# Patient Record
Sex: Female | Born: 1965 | Race: Black or African American | Hispanic: No | Marital: Married | Smoking: Never smoker
Health system: Southern US, Community
[De-identification: ages and names within clinical notes are randomized; demographics above are authoritative.]

## PROBLEM LIST (undated history)

## (undated) DIAGNOSIS — N3090 Cystitis, unspecified without hematuria: Secondary | ICD-10-CM

## (undated) DIAGNOSIS — D219 Benign neoplasm of connective and other soft tissue, unspecified: Secondary | ICD-10-CM

## (undated) DIAGNOSIS — D500 Iron deficiency anemia secondary to blood loss (chronic): Secondary | ICD-10-CM

## (undated) DIAGNOSIS — F419 Anxiety disorder, unspecified: Secondary | ICD-10-CM

## (undated) DIAGNOSIS — N921 Excessive and frequent menstruation with irregular cycle: Secondary | ICD-10-CM

## (undated) DIAGNOSIS — I10 Essential (primary) hypertension: Secondary | ICD-10-CM

## (undated) DIAGNOSIS — G43909 Migraine, unspecified, not intractable, without status migrainosus: Secondary | ICD-10-CM

## (undated) DIAGNOSIS — Z8619 Personal history of other infectious and parasitic diseases: Secondary | ICD-10-CM

## (undated) DIAGNOSIS — B379 Candidiasis, unspecified: Secondary | ICD-10-CM

## (undated) DIAGNOSIS — Z87898 Personal history of other specified conditions: Secondary | ICD-10-CM

## (undated) HISTORY — DX: Migraine, unspecified, not intractable, without status migrainosus: G43.909

## (undated) HISTORY — DX: Benign neoplasm of connective and other soft tissue, unspecified: D21.9

## (undated) HISTORY — DX: Cystitis, unspecified without hematuria: N30.90

## (undated) HISTORY — DX: Candidiasis, unspecified: B37.9

## (undated) HISTORY — DX: Essential (primary) hypertension: I10

## (undated) HISTORY — DX: Excessive and frequent menstruation with irregular cycle: N92.1

## (undated) HISTORY — DX: Anxiety disorder, unspecified: F41.9

## (undated) HISTORY — DX: Personal history of other infectious and parasitic diseases: Z86.19

## (undated) HISTORY — DX: Iron deficiency anemia secondary to blood loss (chronic): D50.0

## (undated) HISTORY — DX: Personal history of other specified conditions: Z87.898

---

## 1992-08-30 HISTORY — PX: TUBAL LIGATION: SHX77

## 1996-08-30 HISTORY — PX: GALLBLADDER SURGERY: SHX652

## 2000-08-30 HISTORY — PX: CYSTECTOMY: SUR359

## 2008-08-30 HISTORY — PX: GASTRIC BYPASS: SHX52

## 2008-10-14 ENCOUNTER — Ambulatory Visit (HOSPITAL_COMMUNITY): Admission: RE | Admit: 2008-10-14 | Discharge: 2008-10-14 | Payer: Self-pay | Admitting: Surgery

## 2008-10-23 ENCOUNTER — Ambulatory Visit (HOSPITAL_COMMUNITY): Admission: RE | Admit: 2008-10-23 | Discharge: 2008-10-23 | Payer: Self-pay | Admitting: Surgery

## 2008-10-30 ENCOUNTER — Ambulatory Visit (HOSPITAL_COMMUNITY): Admission: RE | Admit: 2008-10-30 | Discharge: 2008-10-30 | Payer: Self-pay | Admitting: Surgery

## 2008-11-25 ENCOUNTER — Encounter: Admission: RE | Admit: 2008-11-25 | Discharge: 2009-02-23 | Payer: Self-pay | Admitting: Surgery

## 2009-02-10 ENCOUNTER — Inpatient Hospital Stay (HOSPITAL_COMMUNITY): Admission: RE | Admit: 2009-02-10 | Discharge: 2009-02-13 | Payer: Self-pay | Admitting: Surgery

## 2009-02-11 ENCOUNTER — Encounter (INDEPENDENT_AMBULATORY_CARE_PROVIDER_SITE_OTHER): Payer: Self-pay | Admitting: Surgery

## 2009-02-11 ENCOUNTER — Ambulatory Visit: Payer: Self-pay | Admitting: Vascular Surgery

## 2009-02-26 ENCOUNTER — Encounter: Admission: RE | Admit: 2009-02-26 | Discharge: 2009-02-26 | Payer: Self-pay | Admitting: Surgery

## 2010-03-13 IMAGING — CR DG CHEST 2V
1 series · 1 of 1 positions shown · non-contrast
Comparison: None

CLINICAL DATA: Obesity; hypertension; preoperative respiratory exam
for bariatric procedure

CHEST - 2 VIEW

[view not recorded]
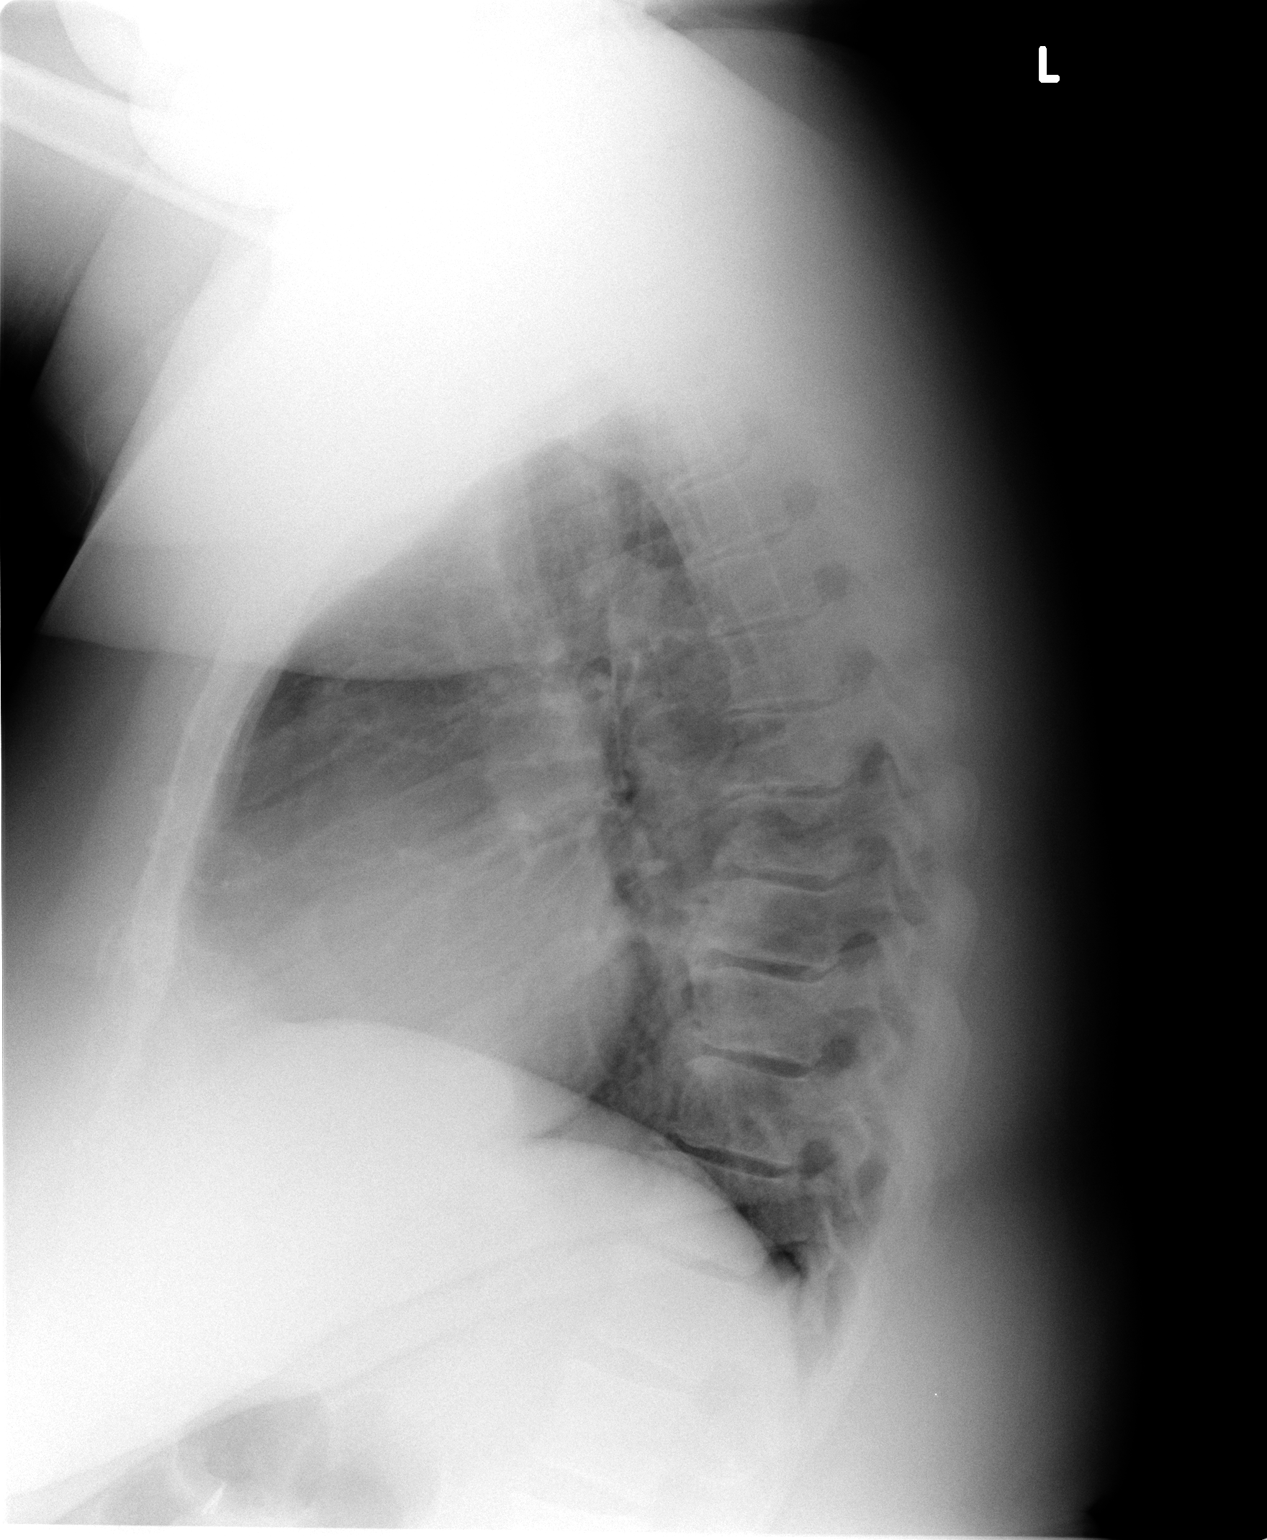

[1 of 1 positions shown; findings below may reference images not displayed]

FINDINGS: The cardiac silhouette, mediastinum, pulmonary
vasculature are within normal limits.  Both lungs are clear.
Hypertrophic degenerative changes are seen within the mid thoracic
spine.
IMPRESSION: There is no evidence of acute cardiac or pulmonary process.

## 2010-03-29 IMAGING — CR DG UGI W/ KUB
2 series · 2 of 2 positions shown · non-contrast
Comparison: None

CLINICAL DATA: Morbid obesity.  Preop for gastric bypass.

UPPER GI SERIES WITH KUB
TECHNIQUE: Routine upper GI series was performed with high density
and thin barium. Effervescent crystals were utilized.
Fluoroscopy Time: 2.8 minutes

[view not recorded (1 of 2)]
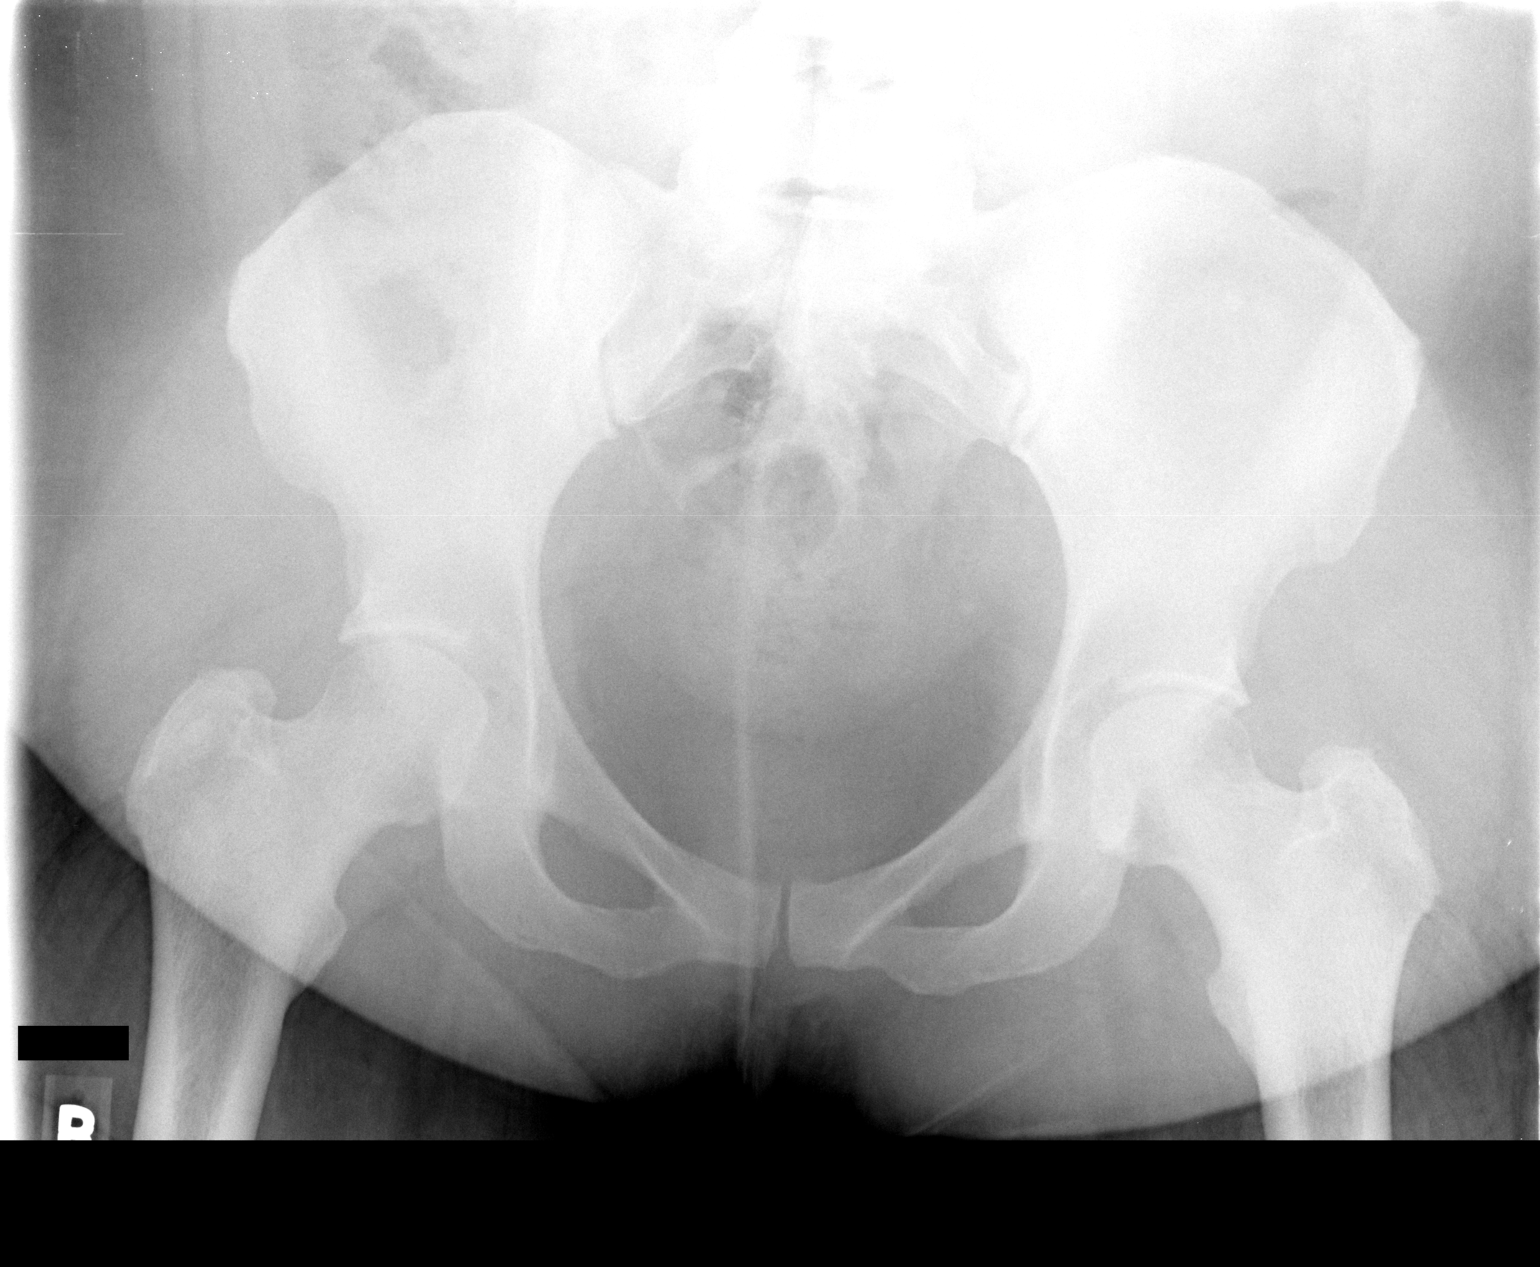

[view not recorded (2 of 2)]
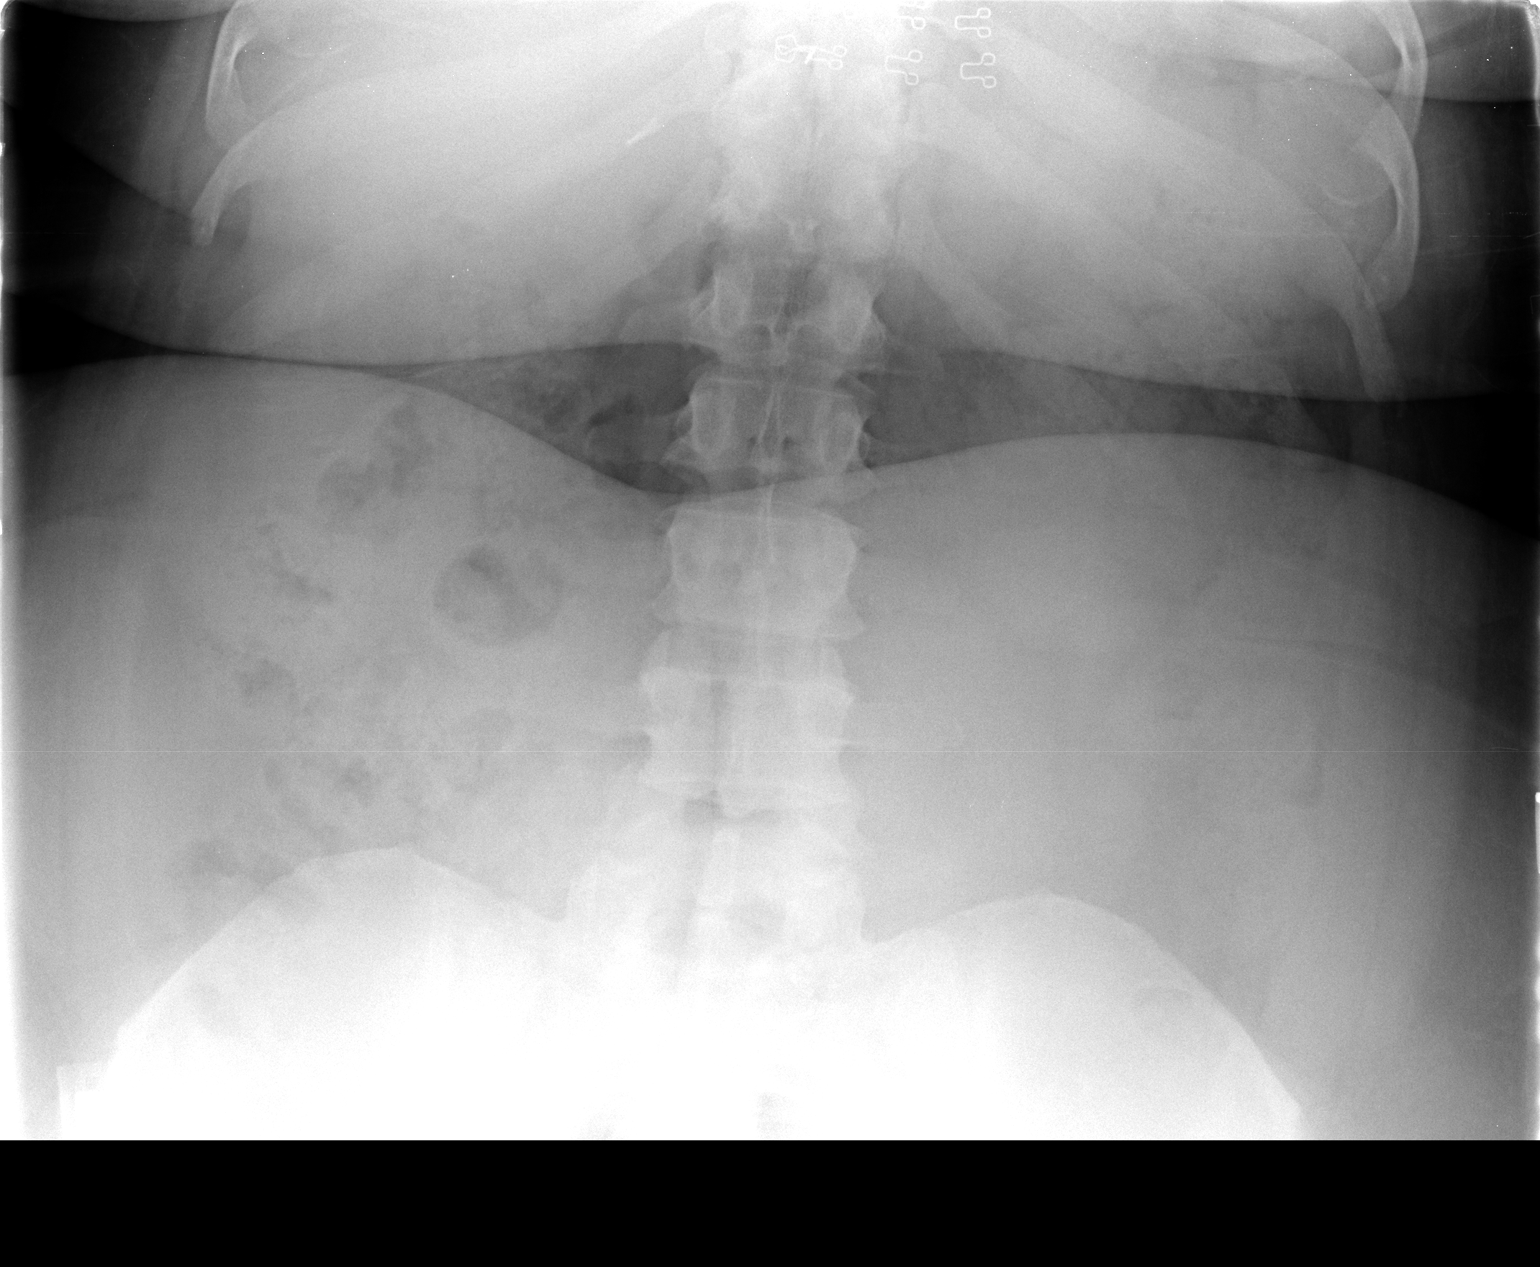

[2 of 2 positions shown; findings below may reference images not displayed]

FINDINGS: There is disruption of all primary peristaltic waves
within the esophagus.  Esophagus is structurally normal.  No
gastroesophageal reflux noted during the procedure or with water
siphon maneuver.

Stomach is unremarkable.  No stricture, fold thickening, or mass.
Duodenal bulb and duodenal sweep unremarkable.
IMPRESSION: Nonspecific esophageal motility disorder.

Otherwise unremarkable study.

## 2010-07-11 IMAGING — CR DG UGI W/ GASTROGRAFIN
2 series · 2 of 2 positions shown · IV contrast (agent unspecified)
Comparison: None

CLINICAL DATA: Status post gastric bypass.  Assess early postop
status.

WATER SOLUBLE UPPER GI SERIES
TECHNIQUE: Single-column upper GI series was performed using water
soluble contrast.
Fluoroscopy Time: 4.7 minutes
Contrast: 50 ccs Mmnipaque-KVV

[view not recorded (1 of 2)]
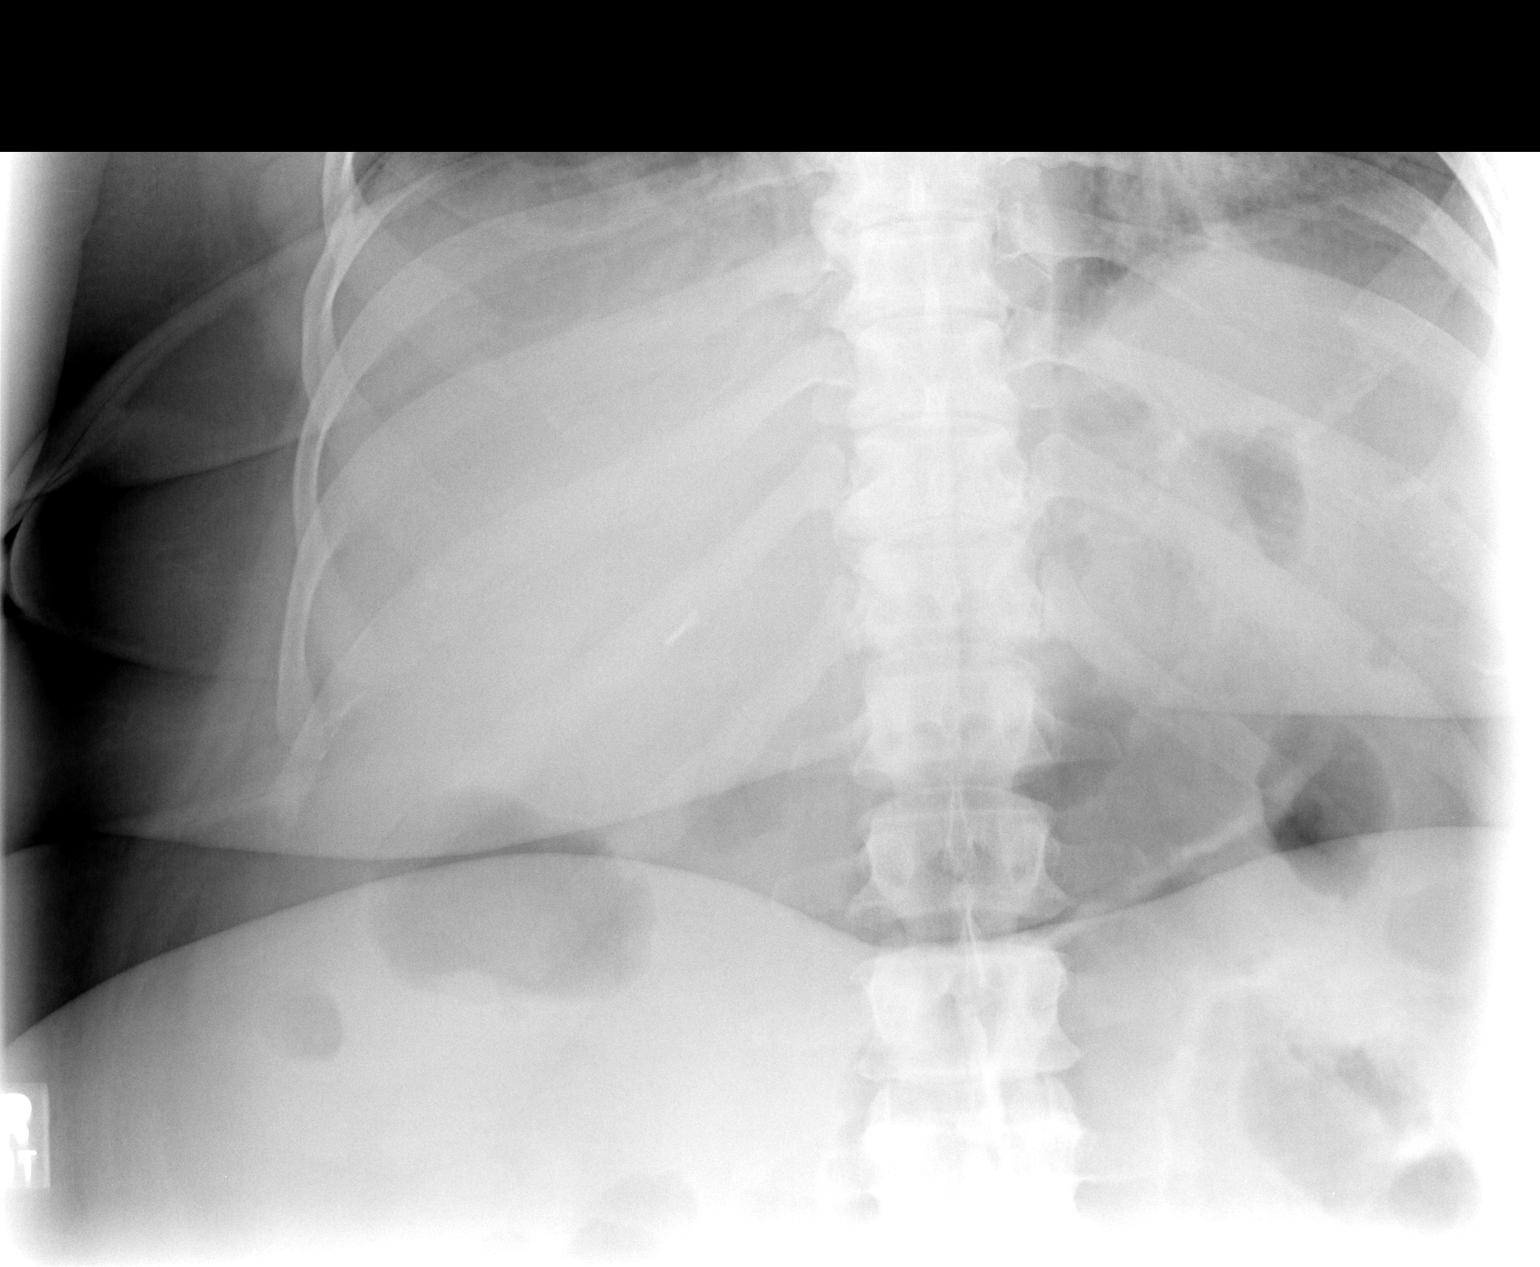

[view not recorded (2 of 2)]
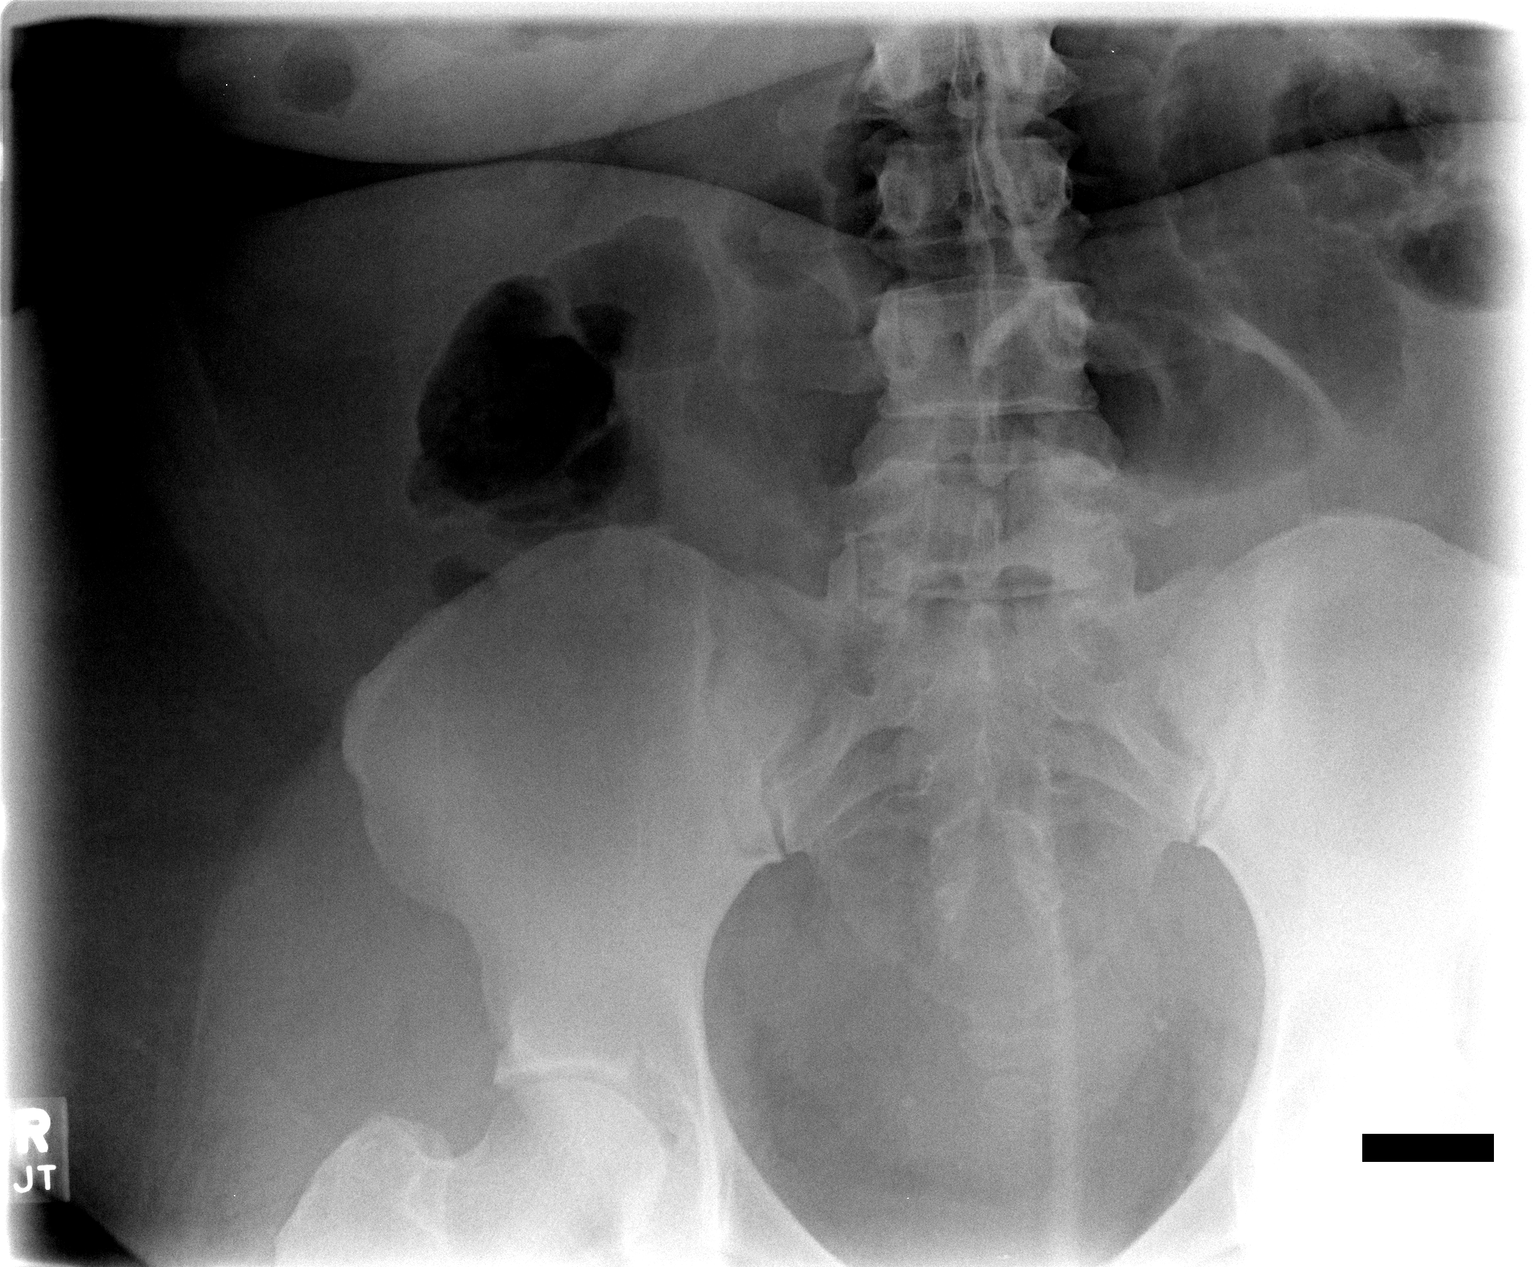

[2 of 2 positions shown; findings below may reference images not displayed]

FINDINGS: The esophagogastric junction appears unremarkable.  The
gastric pouch is intact.  Gastric jejunostomy is intact as well.
So is the jejunojejunal anastomoses.  No evidence of obstruction or
leakage.  There was somewhat sluggish flow and some dilatation of
the jejunum which may be due to the postop state.
IMPRESSION: No leakage or obstruction.  Intact Roux-en-Y anatomy.

## 2010-09-20 ENCOUNTER — Encounter: Payer: Self-pay | Admitting: Obstetrics and Gynecology

## 2010-12-07 LAB — COMPREHENSIVE METABOLIC PANEL
AST: 28 U/L (ref 0–37)
Albumin: 3.4 g/dL — ABNORMAL LOW (ref 3.5–5.2)
BUN: 8 mg/dL (ref 6–23)
Calcium: 9.8 mg/dL (ref 8.4–10.5)
Creatinine, Ser: 0.89 mg/dL (ref 0.4–1.2)
GFR calc Af Amer: >60 mL/min (ref >60)

## 2010-12-07 LAB — CBC
HCT: 31.1 % — ABNORMAL LOW (ref 36.0–46.0)
Hemoglobin: 9.8 g/dL — ABNORMAL LOW (ref 12.0–15.0)
Hemoglobin: 9.4 g/dL — ABNORMAL LOW (ref 12.0–15.0)
MCHC: 31.5 g/dL (ref 30.0–36.0)
MCHC: 32.5 g/dL (ref 30.0–36.0)
MCHC: 32.7 g/dL (ref 30.0–36.0)
MCV: 69.5 fL — ABNORMAL LOW (ref 78.0–100.0)
MCV: 68.4 fL — ABNORMAL LOW (ref 78.0–100.0)
Platelets: 307 10*3/uL (ref 150–400)
RBC: 4.48 MIL/uL (ref 3.87–5.11)
RBC: 4.22 MIL/uL (ref 3.87–5.11)
RDW: 18.8 % — ABNORMAL HIGH (ref 11.5–15.5)
RDW: 19.6 % — ABNORMAL HIGH (ref 11.5–15.5)

## 2010-12-07 LAB — DIFFERENTIAL
Basophils Absolute: 0 10*3/uL (ref 0.0–0.1)
Basophils Absolute: 0 10*3/uL (ref 0.0–0.1)
Basophils Relative: 0 % (ref 0–1)
Basophils Relative: 0 % (ref 0–1)
Basophils Relative: 0 % (ref 0–1)
Eosinophils Absolute: 0 10*3/uL (ref 0.0–0.7)
Eosinophils Absolute: 0 10*3/uL (ref 0.0–0.7)
Eosinophils Relative: 0 % (ref 0–5)
Lymphocytes Relative: 12 % (ref 12–46)
Lymphocytes Relative: 5 % — ABNORMAL LOW (ref 12–46)
Monocytes Absolute: 0.3 10*3/uL (ref 0.1–1.0)
Monocytes Absolute: 0.6 10*3/uL (ref 0.1–1.0)
Monocytes Relative: 3 % (ref 3–12)
Monocytes Relative: 5 % (ref 3–12)
Neutro Abs: 7.2 10*3/uL (ref 1.7–7.7)
Neutrophils Relative %: 89 % — ABNORMAL HIGH (ref 43–77)
Neutrophils Relative %: 82 % — ABNORMAL HIGH (ref 43–77)
Neutrophils Relative %: 90 % — ABNORMAL HIGH (ref 43–77)

## 2010-12-07 LAB — HEMOGLOBIN AND HEMATOCRIT, BLOOD
HCT: 30.4 % — ABNORMAL LOW (ref 36.0–46.0)
HCT: 31.7 % — ABNORMAL LOW (ref 36.0–46.0)
Hemoglobin: 10.1 g/dL — ABNORMAL LOW (ref 12.0–15.0)
Hemoglobin: 9.6 g/dL — ABNORMAL LOW (ref 12.0–15.0)

## 2011-01-12 NOTE — Op Note (Signed)
[name], [name]                   ACCOUNT NO.:  [id number]   MEDICAL RECORD NO.:  [id number]          PATIENT TYPE:  INP   LOCATION:  0004                         FACILITY:  [facility]   PHYSICIAN:  Thornton Park. Daphine Deutscher, MD  DATE OF BIRTH:  Jun 27, 1966   DATE OF PROCEDURE:  02/10/2009  DATE OF DISCHARGE:                               OPERATIVE REPORT   PREOPERATIVE DIAGNOSIS:  Morbid obesity, BMI 61 (down from 65).   PROCEDURE:  Laparoscopic Roux-en-Y gastric bypass with a 1 meter Roux  limb, 40 cm BP limb, ante colic, antegastric candy cane to the left with  closure of Peterson's defect.   SURGEON:  Thornton Park. Daphine Deutscher, MD   DESCRIPTION OF PROCEDURE:  [name] was  taken to room 1, given general anesthesia.  The abdomen was prepped with  a Techni-Care equivalent and draped sterilely.  Access to the abdomen  was gained to the left upper quadrant using a 0 degree OptiView  technique without difficulty insufflating the abdomen and then placing  the lower port on the left through which I then took down some adhesions  from her previous open cholecystectomy.  Standard trocar placements were  used.  The omentum was elevated and the ligament of Treitz was fairly  readily identified.  We measured 40 cm distal to ligament Treitz and  divided it with a single application of the white cartridge Endo-GIA by  [employer].  Latex 1 inch Penrose was placed on the Roux limb end and I  measured the full meter by 4 cm increments and then placed them side-by-  side the BP limb lining up the bowel and opening along the  antimesenteric borders and inserting a 60 mm white been creating a  common channel.  The defect was closed meter end with running 2-0  Vicryls and then Tisseel was applied to this area.   The mesenteric defect was closed with running 2-0 silk and using the Ti-  KNOT and then Ti-KNOT midway up and then finally a Ti-KNOT to complete  the closure.   The omentum was  divided with Harmonic.   Next we put in the upper trocar in the Nathanson's retractor, lifting  the liver and the measuring about 4 cm along the lesser curvature and  created our beginnings of her pouch.  I dissected into the lesser sac  and then went across the greater and the lesser curvature with the Endo-  GIA and two applications later, we had the beginnings of a nice pouch.  We were passing the Ewald tube down between firings to assure that we  did not impinge on the EG junction.  For the final application I used  the Duet stapler to leave some dissolvable material within the staple  lines.   The small bowel was then brought up and a posterior row was placed using  2-0 Vicryl.  We created common gastrotomies and jejunotomies and  inserted the 4.5 blue stapler and fired anastomosis creating a common  hole.  This was closed from either end and then I put an  extra suture in  to buttress that closure.  The Ewald tube was then passed across the  anastomosis and a second layer was created using 2-0 Vicryl on a free  needle.  Laparoties were used at either end.   Next we clamped off the outlet.  Dr. Johna Sheriff scoped and insufflated the  pouch.  No bubbles were seen.  No evidence of leak and he was able to  cannulate the gastrohepatic jejunostomy and looked at that.  She  appeared to have small pouch probably about 4 cm in length.  When he was  scoping her, she did have some spontaneous eructation.   The scope was withdrawn.  Tisseel was applied on the gastrojejunostomy.  The trocars were withdrawn, closed with 4-0 Vicryl, Benzoin and Steri-  Strips.  The patient seemed to tolerate the procedure well, was taken to  recovery room in satisfactory addition.      Thornton Park Daphine Deutscher, MD  Electronically Signed     MBM/MEDQ  D:  02/10/2009  T:  02/10/2009  Job:  [id number]   cc:   Dr. Joyce Copa Fax [phone]

## 2011-01-15 NOTE — Discharge Summary (Signed)
Jill [name], Jill [name]                   ACCOUNT NO.:  [id number]   MEDICAL RECORD NO.:  [id number]          PATIENT TYPE:  INP   LOCATION:  1534                         FACILITY:  [facility]   PHYSICIAN:  Thornton Park. Daphine Deutscher, MD  DATE OF BIRTH:  [birth year]   DATE OF ADMISSION:  02/10/2009  DATE OF DISCHARGE:  02/13/2009                               DISCHARGE SUMMARY   PROCEDURE:  February 10, 2009 laparoscopic Roux-en-Y gastric bypass with  endoscopy.   COURSE IN HOSPITAL:  This 45 year old lady underwent the above-mentioned  operation.  Postoperative day #1, she had a swallow which looked good.  Hemoglobin was 9.4.  Liquids advance per [facility].  She was up,  getting around and doing much better and was ready for discharge on  postop day #3.  Pain was controlled.  She was tolerating shakes.  She  was given Roxicet elixir to take for pain to be followed up in the  office within 2 weeks.   FINAL DIAGNOSIS:  Morbid obesity status post laparoscopic Roux-en-Y  gastric bypass.      Thornton Park Daphine Deutscher, MD  Electronically Signed     MBM/MEDQ  D:  03/12/2009  T:  03/13/2009  Job:  [id number]

## 2011-07-26 ENCOUNTER — Telehealth (INDEPENDENT_AMBULATORY_CARE_PROVIDER_SITE_OTHER): Payer: Self-pay | Admitting: Surgery

## 2011-07-26 NOTE — Telephone Encounter (Signed)
07/26/11 recall letter mailed to patient for bariatric surgery follow-up. Adv pt to call [facility] to schedule an appt..Thomasene Lot

## 2012-03-15 ENCOUNTER — Telehealth (INDEPENDENT_AMBULATORY_CARE_PROVIDER_SITE_OTHER): Payer: Self-pay | Admitting: General Surgery

## 2012-03-15 NOTE — Telephone Encounter (Signed)
Tried to contact the patient to schedule a follow up RNY from 2010. All patient contact numbers are disconncted or not correct.

## 2012-03-15 NOTE — Telephone Encounter (Signed)
Message copied by Latricia Heft on Wed Mar 15, 2012  9:50 AM ------      Message from: Delcie Roch      Created: Tue Mar 14, 2012  4:33 PM      Regarding: schedule appt      Contact: [phone]       Pt called to schedule an appt with Dr. Daphine Deutscher.  She had gastric bypass in 2010 with no follow up.  States she feels fine, just realized she needs follow up.  Not sure where to put her.  Please contact her with an appt.

## 2012-04-05 ENCOUNTER — Ambulatory Visit: Payer: BC Managed Care – HMO | Admitting: Obstetrics and Gynecology

## 2012-04-05 ENCOUNTER — Encounter: Payer: Self-pay | Admitting: Obstetrics and Gynecology

## 2012-04-05 VITALS — BP 126/64 | Temp 98.5°F | Ht 63.0 in | Wt 225.0 lb

## 2012-04-05 DIAGNOSIS — N951 Menopausal and female climacteric states: Secondary | ICD-10-CM

## 2012-04-05 DIAGNOSIS — N926 Irregular menstruation, unspecified: Secondary | ICD-10-CM

## 2012-04-05 DIAGNOSIS — B9689 Other specified bacterial agents as the cause of diseases classified elsewhere: Secondary | ICD-10-CM

## 2012-04-05 DIAGNOSIS — N760 Acute vaginitis: Secondary | ICD-10-CM

## 2012-04-05 DIAGNOSIS — Z1231 Encounter for screening mammogram for malignant neoplasm of breast: Secondary | ICD-10-CM

## 2012-04-05 DIAGNOSIS — Z13228 Encounter for screening for other metabolic disorders: Secondary | ICD-10-CM

## 2012-04-05 DIAGNOSIS — Z1239 Encounter for other screening for malignant neoplasm of breast: Secondary | ICD-10-CM

## 2012-04-05 DIAGNOSIS — Z1321 Encounter for screening for nutritional disorder: Secondary | ICD-10-CM

## 2012-04-05 DIAGNOSIS — Z130 Encounter for screening for diseases of the blood and blood-forming organs and certain disorders involving the immune mechanism: Secondary | ICD-10-CM

## 2012-04-05 DIAGNOSIS — Z1329 Encounter for screening for other suspected endocrine disorder: Secondary | ICD-10-CM

## 2012-04-05 LAB — CBC
HCT: 22 % — ABNORMAL LOW (ref 36.0–46.0)
Hemoglobin: 6.1 g/dL — CL (ref 12.0–15.0)
MCH: 16.6 pg — ABNORMAL LOW (ref 26.0–34.0)
MCHC: 27.7 g/dL — ABNORMAL LOW (ref 30.0–36.0)
MCV: 59.9 fL — ABNORMAL LOW (ref 78.0–100.0)
RDW: 20.9 % — ABNORMAL HIGH (ref 11.5–15.5)

## 2012-04-05 MED ORDER — TINIDAZOLE 500 MG PO TABS: 500.0000 mg | ORAL_TABLET | Freq: Two times a day (BID) | ORAL | Status: AC

## 2012-04-05 NOTE — Progress Notes (Signed)
When did bleeding start: 03/31/12 How  Long: still bleeding How often changing pad/tampon: 3 times per day Bleeding Disorders: no Cramping: yes Contraception: no Fibroids: yes, small Hormone Therapy: no New Medications: no Menopausal Symptoms: yes Vag. Discharge: yes, sometimes with odor Abdominal Pain: yes, before period Increased Stress: yes Pt states she has a period q 2 weeks and also c/o pain during intercourse. Pt also states her hair seems to be falling out and her hormones are "out of whack."

## 2012-04-05 NOTE — Patient Instructions (Signed)
Bacterial Vaginosis Bacterial vaginosis (BV) is a vaginal infection where the normal balance of bacteria in the vagina is disrupted. The normal balance is then replaced by an overgrowth of certain bacteria. There are several different kinds of bacteria that can cause BV. BV is the most common vaginal infection in women of childbearing age. CAUSES   The cause of BV is not fully understood. BV develops when there is an increase or imbalance of harmful bacteria.   Some activities or behaviors can upset the normal balance of bacteria in the vagina and put women at increased risk including:   Having a new sex partner or multiple sex partners.   Douching.   Using an intrauterine device (IUD) for contraception.   It is not clear what role sexual activity plays in the development of BV. However, women that have never had sexual intercourse are rarely infected with BV.  Women do not get BV from toilet seats, bedding, swimming pools or from touching objects around them.  SYMPTOMS   Grey vaginal discharge.   A fish-like odor with discharge, especially after sexual intercourse.   Itching or burning of the vagina and vulva.   Burning or pain with urination.   Some women have no signs or symptoms at all.  DIAGNOSIS  Your caregiver must examine the vagina for signs of BV. Your caregiver will perform lab tests and look at the sample of vaginal fluid through a microscope. They will look for bacteria and abnormal cells (clue cells), a pH test higher than 4.5, and a positive amine test all associated with BV.  RISKS AND COMPLICATIONS   Pelvic inflammatory disease (PID).   Infections following gynecology surgery.   Developing HIV.   Developing herpes virus.  TREATMENT  Sometimes BV will clear up without treatment. However, all women with symptoms of BV should be treated to avoid complications, especially if gynecology surgery is planned. Female partners generally do not need to be treated. However,  BV may spread between female sex partners so treatment is helpful in preventing a recurrence of BV.   BV may be treated with antibiotics. The antibiotics come in either pill or vaginal cream forms. Either can be used with nonpregnant or pregnant women, but the recommended dosages differ. These antibiotics are not harmful to the baby.   BV can recur after treatment. If this happens, a second round of antibiotics will often be prescribed.   Treatment is important for pregnant women. If not treated, BV can cause a premature delivery, especially for a pregnant woman who had a premature birth in the past. All pregnant women who have symptoms of BV should be checked and treated.   For chronic reoccurrence of BV, treatment with a type of prescribed gel vaginally twice a week is helpful.  HOME CARE INSTRUCTIONS   Finish all medication as directed by your caregiver.   Do not have sex until treatment is completed.   Tell your sexual partner that you have a vaginal infection. They should see their caregiver and be treated if they have problems, such as a mild rash or itching.   Practice safe sex. Use condoms. Only have 1 sex partner.  PREVENTION  Basic prevention steps can help reduce the risk of upsetting the natural balance of bacteria in the vagina and developing BV:  Do not have sexual intercourse (be abstinent).   Do not douche.   Use all of the medicine prescribed for treatment of BV, even if the signs and symptoms go away.     Tell your sex partner if you have BV. That way, they can be treated, if needed, to prevent reoccurrence.  SEEK MEDICAL CARE IF:   Your symptoms are not improving after 3 days of treatment.   You have increased discharge, pain, or fever.  MAKE SURE YOU:   Understand these instructions.   Will watch your condition.   Will get help right away if you are not doing well or get worse.  FOR MORE INFORMATION  Division of STD Prevention (DSTDP), Centers for Disease  Control and Prevention: www.cdc.gov/std American Social Health Association (ASHA): www.ashastd.org  Document Released: 08/16/2005 Document Revised: 08/05/2011 Document Reviewed: 02/06/2009 ExitCare Patient Information 2012 [employer]. 

## 2012-04-05 NOTE — Progress Notes (Signed)
Subjective:    Jill [name] is a 46 y.o. female, G4P0014,S/P tubal steriliztaion, who presents for an annual exam. The patient reports bleeding every 2 weeks,  with pad change 3 times daily, and intermittent cramping,  over the past year.  She has noticed vaginal odor with this occurrence and has positional. dyspareunia.  Additionally  she is more emotional, has had  hair loss, night sweats and just feels like her hormones are all  out of "whack". Patient also reports that she is not able to complete an orgasm and would like something for that.  Social Hx:   Lives in [place], [place]  Menstrual cycle:   LMP: Patient's last menstrual period was 03/31/2012.  (see characterization above)             Review of Systems Pertinent items are noted in HPI. Denies urinary tract symptoms, changes in bowel habits, vasomotor symptoms, vaginal dryness,  change in bowel habits,  rectal bleeding, dizziness, lightheadedness, chest pain, or  shortness of breath .  Admits to fatigue.   Objective:    BP 126/64  Temp 98.5 F (36.9 C) (Oral)  Ht 5\' 3"  (1.6 m)  Wt 225 lb (102.059 kg)  BMI 39.86 kg/m2  LMP 03/31/2012    Wt Readings from Last 1 Encounters:  04/05/12 225 lb (102.059 kg)   Body mass index is 39.86 kg/(m^2). General Appearance: Alert, no acute distress HEENT: Grossly normal Neck / Thyroid: Supple, no thyromegaly or cervical adenopathy Lungs: Clear to auscultation bilaterally Back: No CVA tenderness. Cardiovascular: Regular rate and rhythm.  Gastrointestinal: Soft, non-tender, no masses or organomegaly Pelvic Exam: EGBUS-wnl, vagina-normal rugae, cervix- without lesions or tenderness, uterus appears normal size shape and consistency, adnexae-no masses or tenderness Lymphatic Exam: Non-palpable nodes in neck, clavicular,  axillary, or inguinal regions  Skin: no rashes or abnormalities Extremities: no clubbing cyanosis or edema  Neurologic: grossly normal Psychiatric: Alert and oriented; flat  affect  Wet Prep:  pH-5.0,  whiff-positive,  clue  ++ :    Assessment:    Probably Anovulatory Cycle H/O Ovarian Cyst Irregular Bleeding Menopausal Symptoms Bacterial Vaginosis Sexual Dysfunction   Plan:   MG,  pelvic ultrasound-pending  Reviewed endometrial biopsy  TSH, CBC, estradiol, progesterone, vitamin d 25-H, testosterone-pending  RTO for ultrasound and endometrial biopsy or prn  Melodi Happel,ELMIRAPA-C

## 2012-04-06 LAB — TESTOSTERONE, FREE, TOTAL, SHBG: Sex Hormone Binding: 153 nmol/L — ABNORMAL HIGH (ref 18–114)

## 2012-04-06 LAB — PROGESTERONE: Progesterone: 0.4 ng/mL

## 2012-04-06 LAB — TSH: TSH: 1.531 u[IU]/mL (ref 0.350–4.500)

## 2012-04-10 ENCOUNTER — Telehealth: Payer: Self-pay | Admitting: Obstetrics and Gynecology

## 2012-04-10 DIAGNOSIS — E559 Vitamin D deficiency, unspecified: Secondary | ICD-10-CM

## 2012-04-10 MED ORDER — AMBULATORY NON FORMULARY MEDICATION: 1.0000 | Status: DC

## 2012-04-10 NOTE — Telephone Encounter (Signed)
Call to home phone and left message for patient to call as soon as possible along with office phone number. Jalissa Heinzelman,PA-C

## 2012-04-10 NOTE — Telephone Encounter (Signed)
Return call to patient (lives in [place]) to review lab results: Vitamin D and Testosterone are not detectable.  Vitamin D protocol initiated.  Patient's H/H= 6.1/22.0 and patient advised of the cardiovascular dangers of a hemoglobin as low as hers.  Offered transfusion-patient declined.  Still admits to only occasional lightheadedness but denies fainting, chest pain, shortness of breath or vision changes.  Advised patient to begin iron supplementation (states all she's used before upsets her stomach) to have daughter pick up samples to try in an effort to find one she is able to tolerated.  Patient made aware that she will need more work up of her anemia even though she has had excessive vaginal bleeding-patient agreeable.  Ariya Bohannon, PA-C

## 2012-04-10 NOTE — Telephone Encounter (Signed)
Triage/gen.quest °

## 2012-04-10 NOTE — Telephone Encounter (Signed)
Spoke with pt rgd msg pt returning ep call advised pt will  Inform ep pt voice understanding

## 2012-04-10 NOTE — Telephone Encounter (Signed)
Message stated that voicemail box had not been set up yet. Johnn Krasowski, PA-C

## 2012-04-12 ENCOUNTER — Telehealth: Payer: Self-pay

## 2012-04-12 ENCOUNTER — Telehealth: Payer: Self-pay | Admitting: *Deleted

## 2012-04-12 DIAGNOSIS — D649 Anemia, unspecified: Secondary | ICD-10-CM

## 2012-04-12 NOTE — Telephone Encounter (Signed)
patient confirmed over the phone the new date and time of the new patient appointment on 05-02-2012 starting at 1:30pm

## 2012-04-12 NOTE — Telephone Encounter (Signed)
TC TO [facility] TO MAKE SURE THEY RECEIVED OUR REFERRAL AND SPOKE WITH TIFFANY AND SHE STATED THAT SHE HAS THE REFERRAL AND THEY WILL CONTACT PT.

## 2012-04-12 NOTE — Telephone Encounter (Signed)
TRIAGE/RETURN CALL °

## 2012-04-14 ENCOUNTER — Telehealth: Payer: Self-pay | Admitting: Oncology

## 2012-04-17 ENCOUNTER — Other Ambulatory Visit: Payer: Self-pay | Admitting: Oncology

## 2012-04-17 DIAGNOSIS — D649 Anemia, unspecified: Secondary | ICD-10-CM

## 2012-04-18 ENCOUNTER — Telehealth: Payer: Self-pay | Admitting: Oncology

## 2012-04-18 NOTE — Telephone Encounter (Signed)
C/D on 8/20for 9/3 appt.

## 2012-04-20 ENCOUNTER — Ambulatory Visit (HOSPITAL_COMMUNITY): Admission: RE | Admit: 2012-04-20 | Payer: Self-pay | Source: Ambulatory Visit

## 2012-04-20 ENCOUNTER — Encounter: Payer: Self-pay | Admitting: Obstetrics and Gynecology

## 2012-04-25 ENCOUNTER — Encounter: Payer: Self-pay | Admitting: Oncology

## 2012-04-25 ENCOUNTER — Encounter: Payer: Self-pay | Admitting: Obstetrics and Gynecology

## 2012-04-25 ENCOUNTER — Ambulatory Visit (INDEPENDENT_AMBULATORY_CARE_PROVIDER_SITE_OTHER): Payer: BC Managed Care – HMO | Admitting: Obstetrics and Gynecology

## 2012-04-25 ENCOUNTER — Ambulatory Visit (INDEPENDENT_AMBULATORY_CARE_PROVIDER_SITE_OTHER): Payer: BC Managed Care – HMO

## 2012-04-25 VITALS — BP 114/72 | HR 78 | Wt 219.0 lb

## 2012-04-25 DIAGNOSIS — N926 Irregular menstruation, unspecified: Secondary | ICD-10-CM

## 2012-04-25 DIAGNOSIS — D219 Benign neoplasm of connective and other soft tissue, unspecified: Secondary | ICD-10-CM

## 2012-04-25 DIAGNOSIS — R6882 Decreased libido: Secondary | ICD-10-CM

## 2012-04-25 DIAGNOSIS — D649 Anemia, unspecified: Secondary | ICD-10-CM

## 2012-04-25 DIAGNOSIS — N921 Excessive and frequent menstruation with irregular cycle: Secondary | ICD-10-CM

## 2012-04-25 DIAGNOSIS — N83209 Unspecified ovarian cyst, unspecified side: Secondary | ICD-10-CM

## 2012-04-25 DIAGNOSIS — N920 Excessive and frequent menstruation with regular cycle: Secondary | ICD-10-CM

## 2012-04-25 DIAGNOSIS — D259 Leiomyoma of uterus, unspecified: Secondary | ICD-10-CM

## 2012-04-25 NOTE — Progress Notes (Signed)
46 YO S/P BTL seen 04/05/12,  for heavy irregular vaginal bleeding and found to have severe anemia (Hgb 6.1). Patient declined a blood transfusion,  has a referral to a hematologist (scheduled for 04/27/12) and is on iron therapy. Patient's testosterone and Vitamin D were not detectable, with normal thyroid and estrogen and low normal progesterone.  Patient has not had a period since last visit 04/05/12. She presents today for an ultrasound.   O: U/S- uterus-10.00 x 7.21 x 6.66 cm with #2 anterior intramural fibroids < 3.5 cm; complex right ovarian cyst, 3.4 cm with no increased  color doppler flow;      normal appearing left ovary   A: Menometrorrhagia     Severe Anemia (Hgb 6.1)     Vitamin D Deficiency     Complex Right Ovarian Cyst (3.5 cm)     Fibroids (#2-< 3.5 cm)     Menopausal Symptoms     Decreased Libido  P: Complete referral to Hematologist and Vitamin D Deficiency Protocol      Repeat U/S in 6-8 weeks for right ovarian cyst; torsion precautions given     Reviewed ovarian cysts, physiology and management      Reviewed management options for menometrorrhagia: BCPs, Lysteda, Mirena IUD, endometrial ablation     hysterectomy and observation.  Patient is interested in endometrial ablation      Reviewed endometrial ablation in-office & at hospital and limitations of the former     Advised of need for an endometrial biopsy along with the indications for it and procedure.      Patient to have a light meal and take Aleve at least an hour before coming for endometrial biopsy.      To investigate suitability of patient for in-office ablation; information given on ablation in general and Novasure in particular       Patient wants to try Testosterone and Progesterone topically.  To explore [facility] in [place], [place] and order      Testosterone 0.1 mg/2ml daily applied between thighs and Progesterone 20 mg/ml topically daily.       RTO-as scheduled      Lenita Peregrina, PA-C

## 2012-04-25 NOTE — Patient Instructions (Signed)
Endometrial Ablation Endometrial ablation removes the lining of the uterus (endometrium). It is usually a same day, outpatient treatment. Ablation helps avoid major surgery (such as a hysterectomy). A hysterectomy is removal of the cervix and uterus. Endometrial ablation has less risk and complications, has a shorter recovery period and is less expensive. After endometrial ablation, most women will have little or no menstrual bleeding. You may not keep your fertility. Pregnancy is no longer likely after this procedure but if you are pre-menopausal, you still need to use a reliable method of birth control following the procedure because pregnancy can occur. REASONS TO HAVE THE PROCEDURE MAY INCLUDE:  Heavy periods.   Bleeding that is causing anemia.   Anovulatory bleeding, very irregular, bleeding.   Bleeding submucous fibroids (on the lining inside the uterus) if they are smaller than 3 centimeters.  REASONS NOT TO HAVE THE PROCEDURE MAY INCLUDE:  You wish to have more children.   You have a pre-cancerous or cancerous problem. The cause of any abnormal bleeding must be diagnosed before having the procedure.   You have pain coming from the uterus.   You have a submucus fibroid larger than 3 centimeters.   You recently had a baby.   You recently had an infection in the uterus.   You have a severe retro-flexed, tipped uterus and cannot insert the instrument to do the ablation.   You had a Cesarean section or deep major surgery on the uterus.   The inner cavity of the uterus is too large for the endometrial ablation instrument.  RISKS AND COMPLICATIONS   Perforation of the uterus.   Bleeding.   Infection of the uterus, bladder or vagina.   Injury to surrounding organs.   Cutting the cervix.   An air bubble to the lung (air embolus).   Pregnancy following the procedure.   Failure of the procedure to help the problem requiring hysterectomy.   Decreased ability to diagnose  cancer in the lining of the uterus.  BEFORE THE PROCEDURE  The lining of the uterus must be tested to make sure there is no pre-cancerous or cancer cells present.   Medications may be given to make the lining of the uterus thinner.   Ultrasound may be used to evaluate the size and look for abnormalities of the uterus.   Future pregnancy is not desired.  PROCEDURE  There are different ways to destroy the lining of the uterus.   Resectoscope - radio frequency-alternating electric current is the most common one used.   Cryotherapy - freezing the lining of the uterus.   Heated Free Liquid - heated salt (saline) solution inserted into the uterus.   Microwave - uses high energy microwaves in the uterus.   Thermal Balloon - a catheter with a balloon tip is inserted into the uterus and filled with heated fluid.  Your caregiver will talk with you about the method used in this clinic. They will also instruct you on the pros and cons of the procedure. Endometrial ablation is performed along with a procedure called operative hysteroscopy. A narrow viewing tube is inserted through the birth canal (vagina) and through the cervix into the uterus. A tiny camera attached to the viewing tube (hysteroscope) allows the uterine cavity to be shown on a TV monitor during surgery. Your uterus is filled with a harmless liquid to make the procedure easier. The lining of the uterus is then removed. The lining can also be removed with a resectoscope which allows your surgeon   to cut away the lining of the uterus under direct vision. Usually, you will be able to go home within an hour after the procedure. HOME CARE INSTRUCTIONS   Do not drive for 24 hours.   No tampons, douching or intercourse for 2 weeks or until your caregiver approves.   Rest at home for 24 to 48 hours. You may then resume normal activities unless told differently by your caregiver.   Take your temperature two times a day for 4 days, and record  it.   Take any medications your caregiver has ordered, as directed.   Use some form of contraception if you are pre-menopausal and do not want to get pregnant.  Bleeding after the procedure is normal. It varies from light spotting and mildly watery to bloody discharge for 4 to 6 weeks. You may also have mild cramping. Only take over-the-counter or prescription medicines for pain, discomfort, or fever as directed by your caregiver. Do not use aspirin, as this may aggravate bleeding. Frequent urination during the first 24 hours is normal. You will not know how effective your surgery is until at least 3 months after the surgery. SEEK IMMEDIATE MEDICAL CARE IF:   Bleeding is heavier than a normal menstrual cycle.   An oral temperature above 102 F (38.9 C) develops.   You have increasing cramps or pains not relieved with medication or develop belly (abdominal) pain which does not seem to be related to the same area of earlier cramping and pain.   You are light headed, weak or have fainting episodes.   You develop pain in the shoulder strap areas.   You have chest or leg pain.   You have abnormal vaginal discharge.   You have painful urination.  Document Released: 06/25/2004 Document Revised: 08/05/2011 Document Reviewed: 09/23/2007 ExitCare Patient Information 2012 [employer]. 

## 2012-04-27 ENCOUNTER — Encounter: Payer: Self-pay | Admitting: Oncology

## 2012-04-27 ENCOUNTER — Encounter (HOSPITAL_COMMUNITY)
Admission: RE | Admit: 2012-04-27 | Discharge: 2012-04-27 | Disposition: A | Discharge status: Home / Self Care | Payer: BC Managed Care – HMO | Source: Ambulatory Visit | Attending: Oncology | Admitting: Oncology

## 2012-04-27 ENCOUNTER — Telehealth: Payer: Self-pay | Admitting: Oncology

## 2012-04-27 ENCOUNTER — Other Ambulatory Visit (HOSPITAL_BASED_OUTPATIENT_CLINIC_OR_DEPARTMENT_OTHER): Payer: BC Managed Care – HMO | Admitting: Lab

## 2012-04-27 ENCOUNTER — Ambulatory Visit (HOSPITAL_BASED_OUTPATIENT_CLINIC_OR_DEPARTMENT_OTHER): Payer: BC Managed Care – HMO | Admitting: Oncology

## 2012-04-27 ENCOUNTER — Ambulatory Visit: Payer: Self-pay

## 2012-04-27 VITALS — BP 121/73 | HR 72 | Temp 98.3°F | Resp 18 | Ht 62.0 in | Wt 222.6 lb

## 2012-04-27 DIAGNOSIS — N920 Excessive and frequent menstruation with regular cycle: Secondary | ICD-10-CM

## 2012-04-27 DIAGNOSIS — D509 Iron deficiency anemia, unspecified: Secondary | ICD-10-CM

## 2012-04-27 DIAGNOSIS — D649 Anemia, unspecified: Secondary | ICD-10-CM | POA: Insufficient documentation

## 2012-04-27 DIAGNOSIS — Z9884 Bariatric surgery status: Secondary | ICD-10-CM

## 2012-04-27 LAB — CBC WITH DIFFERENTIAL/PLATELET
BASO%: 0.4 % (ref 0.0–2.0)
EOS%: 0.9 % (ref 0.0–7.0)
HGB: 6.1 g/dL — CL (ref 11.6–15.9)
MCH: 16.4 pg — ABNORMAL LOW (ref 25.1–34.0)
MCHC: 27.2 g/dL — ABNORMAL LOW (ref 31.5–36.0)
MONO#: 0.5 10*3/uL (ref 0.1–0.9)
RDW: 22.1 % — ABNORMAL HIGH (ref 11.2–14.5)
WBC: 5.4 10*3/uL (ref 3.9–10.3)
lymph#: 1.4 10*3/uL (ref 0.9–3.3)

## 2012-04-27 LAB — COMPREHENSIVE METABOLIC PANEL (CC13)
Albumin: 3.4 g/dL — ABNORMAL LOW (ref 3.5–5.0)
Alkaline Phosphatase: 171 U/L — ABNORMAL HIGH (ref 40–150)
BUN: 16 mg/dL (ref 7.0–26.0)
Chloride: 109 mEq/L — ABNORMAL HIGH (ref 98–107)
Creatinine: 0.7 mg/dL (ref 0.6–1.1)
Glucose: 83 mg/dl (ref 70–99)
Potassium: 4.1 mEq/L (ref 3.5–5.1)

## 2012-04-27 LAB — IRON AND TIBC
Iron: 13 ug/dL — ABNORMAL LOW (ref 42–145)
TIBC: 470 ug/dL (ref 250–470)
UIBC: 457 ug/dL — ABNORMAL HIGH (ref 125–400)

## 2012-04-27 LAB — CORRECTED CALCIUM (CC13): Calcium, Corrected: 9 mg/dL (ref 8.4–10.4)

## 2012-04-27 LAB — ABO/RH: ABO/RH(D): B POS

## 2012-04-27 LAB — PREPARE RBC (CROSSMATCH)

## 2012-04-27 NOTE — Telephone Encounter (Signed)
gve the pt her aug, sept,oct,nov,a nd dec 2013 appt calendars

## 2012-04-27 NOTE — Patient Instructions (Addendum)
1.  Issue:  Anemia. 2.  Possibility:  Most likely from heavy menstrual bleeding.   3.  Work up:  I sent for blood test to confirm that you're iron deficient.  4.  Treatment:   - One unit packed red blood cell transfusion tomorrow.  Too low hgb can stress the heart.  - IV Iron Feraheme tomorrow. - Start oral iron TWICE daily along with orange juice or Vit C to increase absorption.  There are many different types of oral iron.  However, the two that cause the least stomach upset are SlowFe and NuIron.  5.  Follow up: - Once a week blood check here for 1 month; then monthly blood check x 3 months to ensure improvement of Hgb.  - Consider seeing a Gynecologist to discuss either birth control; endometrial ablation, or hysterectomy.

## 2012-04-27 NOTE — Progress Notes (Signed)
[facility]  [phone] Fax:([phone]     INITIAL HEMATOLOGY CONSULTATION    Referral Provider:  Henreitta Leber, PA  Reason for Referral: symptomatic anemia.     HPI:  [name] is a 46 year-old woman with history of menometrorrhagia, also s/p gastric bypass in 2010.  She has 2 month-history of worsened menometrorrhagia.  Her PCP obtained a CBC on 04/05/2012 with Hgb of 6.1.  Therefore, she was kindly referred to the [facility] for evaluation.  Mrs. Hord presented to the clinic for the first time today by herself.  She reported that her menstrual cycle is every 2-3 weeks.  Each time, it lasts for about 5-6 days with the first 4 days are heavy with clots.  She changes her pads/tampons about 3-4 times a day.  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  She has had orthostatic dizziness.  She has SOB, DOE and palpitation.  She denied other source of bleeding.  Patient denies fever, anorexia, weight loss, headache, visual changes, confusion, drenching night sweats, palpable lymph node swelling, mucositis, odynophagia, dysphagia, nausea vomiting, jaundice, chest pain, productive cough, gum bleeding, epistaxis, hematemesis, hemoptysis, abdominal pain, abdominal swelling, early satiety, melena, hematochezia, hematuria, skin rash, spontaneous bleeding, joint swelling, joint pain, heat or cold intolerance, bowel bladder incontinence, back pain, focal motor weakness, paresthesia.      Past Medical History  Diagnosis Date  . Fibroids   . Yeast infection   . H/O varicella   . History of measles, mumps, or rubella   . Yeast infection   . Bladder infection   . Hypertension   . Menometrorrhagia   . Iron deficiency anemia due to chronic blood loss   :    Past Surgical History  Procedure Date  . Gastric bypass 2010  . Cystectomy 2002    polymod cyst  .  Gallbladder surgery 1998  . Tubal ligation 1994  :   CURRENT MEDS: Current Outpatient Prescriptions  Medication Sig Dispense Refill  . AMBULATORY NON FORMULARY MEDICATION Take 1 tablet by mouth 2 (two) times a week. Medication Name: Vitamin D 50,000 units 1 po twice weekly x 8 weeks  16 tablet  0  . Cyanocobalamin (B-12) 100 MCG TABS Take by mouth.      . ferrous sulfate 325 (65 FE) MG tablet Take 325 mg by mouth daily with breakfast.      . Ibuprofen (ADVIL PO) Take by mouth. Take as needed      . Multiple Vitamins-Minerals (MULTIVITAMIN WITH MINERALS) tablet Take 1 tablet by mouth daily.      . naproxen sodium (ANAPROX) 220 MG tablet Take 220 mg by mouth 2 (two) times daily with a meal.          No Known Allergies:  Family History  Problem Relation Age of Onset  . Asthma Maternal Grandmother   . Heart disease Father   . Cancer Father     bladder, prostate  . Hypertension Father   . Hypertension Mother   . Anemia Mother     low iron/ receives blood transfusions  . Autoimmune disease Mother   . Hypertension Sister   . Thyroid disease Sister   :  History   Social History  . Marital Status: Married    Spouse Name: N/A    Number of Children: 3  . Years of  Education: N/A   Occupational History  .      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 Smoking status: Never Smoker   . Smokeless tobacco: Never Used  . Alcohol Use: No  . Drug Use: No  . Sexually Active: Yes -- Female partner(s)    Birth Control/ Protection: None, Surgical     BTL    Other Topics Concern  . Not on file   Social History Narrative  . No narrative on file  :  REVIEW OF SYSTEM:  The rest of the 14-point review of sytem was negative.   Exam: ECOG 1  General:  well-nourished woman, in no acute distress.  Eyes:  no scleral icterus.  ENT:  There were no oropharyngeal lesions.  Neck was without thyromegaly.  Lymphatics:  Negative cervical, supraclavicular or axillary  adenopathy.  Respiratory: lungs were clear bilaterally without wheezing or crackles.  Cardiovascular:  Regular rate and rhythm, S1/S2, without murmur, rub or gallop.  There was no pedal edema.  GI:  abdomen was soft, flat, nontender, nondistended, without organomegaly.  Muscoloskeletal:  no spinal tenderness of palpation of vertebral spine.  Skin exam was without echymosis, petichae.  Neuro exam was nonfocal.  Patient was able to get on and off exam table without assistance.  Gait was normal.  Patient was alerted and oriented.  Attention was good.   Language was appropriate.  Mood was normal without depression.  Speech was not pressured.  Thought content was not tangential.    LABS:  Lab Results  Component Value Date   WBC 5.4 04/27/2012   HGB 6.1* 04/27/2012   HCT 22.4* 04/27/2012   PLT 293 04/27/2012   GLUCOSE 83 04/27/2012   ALT 13 04/27/2012   AST 18 04/27/2012   NA 136 04/27/2012   K 4.1 04/27/2012   CL 109* 04/27/2012   CREATININE 0.7 04/27/2012   BUN 16.0 04/27/2012   CO2 21* 04/27/2012    Blood smear review:   I personally reviewed the patient's peripheral blood smear today.  There was  anisocytosis.  There were marked central pallor.  There was occasional cigar-shaped cells. There was no peripheral blast.  There was no schistocytosis, spherocytosis, target cell, rouleaux formation, tear drop cell.  There was no giant platelets or platelet clumps.      ASSESSMENT AND PLAN:   1.  Menometrorrhagia:  Pending evaluation by Gyn for endometrial ablation in the near future.  She has known history of uterine fibroid.   2.  History of gastric bypass surgery in 2010.  3.  Microcytic anemia:  - Most likely due to chronic blood loss with resultant iron deficiency.  There may also a component of mal absorption given history of gastric bypass surgery.  There is also a possible concomitant VitB12 deficiency. She denied family history of sickle cell and thalassemia.  These two entities are difficult to  test with concurrent severe iron deficiency. There no low suspicion for hemolysis with normal Tbili and LDH.   - Work up:  Iron panel, Vit B12.    In the future, if despite iron repletion and she still has anemia, I may consider testing for hemoglobin electrophoresis. - Treatment:  She has symptomatic anemia with Hgb of 6.1,  I strongly recommended pRBC transfusion to decrease cardiac complication.  She has been on iron for the last two weeks without significant improvement of her anemia yet. I advised her to take oral iron along with VitC to  increase absorption.  I recommended IV iron Feraheme.  I explained to here that there may be a slight risk of infusion reaction.  She expressed informed understanding and wished to proceed as recommended.  - Follow up:  [facility]ly [facility] at the [facility] to ensure improvement of her Hgb from 6.1 then monthly CBC.  She has return appointment in about 4 months.     Thank you for this referral.   The length of time of the face-to-face encounter was 30 minutes. More than 50% of time was spent counseling and coordination of care.

## 2012-04-28 ENCOUNTER — Ambulatory Visit: Payer: BC Managed Care – HMO

## 2012-04-28 ENCOUNTER — Ambulatory Visit (HOSPITAL_BASED_OUTPATIENT_CLINIC_OR_DEPARTMENT_OTHER): Payer: BC Managed Care – HMO

## 2012-04-28 VITALS — BP 95/50 | HR 73 | Temp 99.0°F | Resp 18

## 2012-04-28 DIAGNOSIS — D649 Anemia, unspecified: Secondary | ICD-10-CM

## 2012-04-28 LAB — TYPE AND SCREEN: Unit division: 0

## 2012-04-28 MED ORDER — DIPHENHYDRAMINE HCL 25 MG PO CAPS
25.0000 mg | ORAL_CAPSULE | Freq: Once | ORAL | Status: AC
  Administered 2012-04-28: 25 mg via ORAL

## 2012-04-28 MED ORDER — ACETAMINOPHEN 325 MG PO TABS
650.0000 mg | ORAL_TABLET | Freq: Once | ORAL | Status: AC
  Administered 2012-04-28: 650 mg via ORAL

## 2012-04-28 MED ORDER — SODIUM CHLORIDE 0.9 % IV SOLN
250.0000 mL | Freq: Once | INTRAVENOUS | Status: AC
  Administered 2012-04-28: 250 mL via INTRAVENOUS

## 2012-04-28 MED ORDER — SODIUM CHLORIDE 0.9 % IV SOLN
1020.0000 mg | Freq: Once | INTRAVENOUS | Status: AC
  Administered 2012-04-28: 1020 mg via INTRAVENOUS
  Filled 2012-04-28: qty 34

## 2012-04-28 NOTE — Patient Instructions (Addendum)
Blood Transfusion Information  WHAT IS A BLOOD TRANSFUSION?  A transfusion is the replacement of blood or some of its parts. Blood is made up of multiple cells which provide different functions.   Red blood cells carry oxygen and are used for blood loss replacement.   White blood cells fight against infection.   Platelets control bleeding.   Plasma helps clot blood.   Other blood products are available for specialized needs, such as hemophilia or other clotting disorders.  BEFORE THE TRANSFUSION   Who gives blood for transfusions?    You may be able to donate blood to be used at a later date on yourself (autologous donation).   Relatives can be asked to donate blood. This is generally not any safer than if you have received blood from a stranger. The same precautions are taken to ensure safety when a relative's blood is donated.   Healthy volunteers who are fully evaluated to make sure their blood is safe. This is blood bank blood.  Transfusion therapy is the safest it has ever been in the practice of medicine. Before blood is taken from a donor, a complete history is taken to make sure that person has no history of diseases nor engages in risky social behavior (examples are intravenous drug use or sexual activity with multiple partners). The donor's travel history is screened to minimize risk of transmitting infections, such as malaria. The donated blood is tested for signs of infectious diseases, such as HIV and hepatitis. The blood is then tested to be sure it is compatible with you in order to minimize the chance of a transfusion reaction. If you or a relative donates blood, this is often done in anticipation of surgery and is not appropriate for emergency situations. It takes many days to process the donated blood.  RISKS AND COMPLICATIONS  Although transfusion therapy is very safe and saves many lives, the main dangers of transfusion include:    Getting an infectious disease.   Developing a  transfusion reaction. This is an allergic reaction to something in the blood you were given. Every precaution is taken to prevent this.  The decision to have a blood transfusion has been considered carefully by your caregiver before blood is given. Blood is not given unless the benefits outweigh the risks.  AFTER THE TRANSFUSION   Right after receiving a blood transfusion, you will usually feel much better and more energetic. This is especially true if your red blood cells have gotten low (anemic). The transfusion raises the level of the red blood cells which carry oxygen, and this usually causes an energy increase.   The nurse administering the transfusion will monitor you carefully for complications.  HOME CARE INSTRUCTIONS   No special instructions are needed after a transfusion. You may find your energy is better. Speak with your caregiver about any limitations on activity for underlying diseases you may have.  SEEK MEDICAL CARE IF:    Your condition is not improving after your transfusion.   You develop redness or irritation at the intravenous (IV) site.  SEEK IMMEDIATE MEDICAL CARE IF:   Any of the following symptoms occur over the next 12 hours:   Shaking chills.   You have a temperature by mouth above 102 F (38.9 C), not controlled by medicine.   Chest, back, or muscle pain.   People around you feel you are not acting correctly or are confused.   Shortness of breath or difficulty breathing.   Dizziness and fainting.     You get a rash or develop hives.   You have a decrease in urine output.   Your urine turns a dark color or changes to pink, red, or brown.  Any of the following symptoms occur over the next 10 days:   You have a temperature by mouth above 102 F (38.9 C), not controlled by medicine.   Shortness of breath.   Weakness after normal activity.   The white part of the eye turns yellow (jaundice).   You have a decrease in the amount of urine or are urinating less often.   Your  urine turns a dark color or changes to pink, red, or brown.  Document Released: 08/13/2000 Document Revised: 08/05/2011 Document Reviewed: 04/01/2008  ExitCare Patient Information 2012 [employer].

## 2012-04-29 LAB — TYPE AND SCREEN
Antibody Screen: NEGATIVE
Unit division: 0

## 2012-05-02 ENCOUNTER — Ambulatory Visit: Payer: Self-pay

## 2012-05-02 ENCOUNTER — Other Ambulatory Visit: Payer: Self-pay | Admitting: Lab

## 2012-05-02 ENCOUNTER — Ambulatory Visit: Payer: Self-pay | Admitting: Oncology

## 2012-05-04 ENCOUNTER — Other Ambulatory Visit: Payer: BC Managed Care – HMO

## 2012-05-04 DIAGNOSIS — D649 Anemia, unspecified: Secondary | ICD-10-CM

## 2012-05-04 LAB — CBC WITH DIFFERENTIAL/PLATELET
Basophils Absolute: 0 10*3/uL (ref 0.0–0.1)
Eosinophils Absolute: 0.1 10*3/uL (ref 0.0–0.5)
HGB: 8 g/dL — ABNORMAL LOW (ref 11.6–15.9)
MCV: 66.8 fL — ABNORMAL LOW (ref 79.5–101.0)
MONO#: 0.4 10*3/uL (ref 0.1–0.9)
MONO%: 5.7 % (ref 0.0–14.0)
NEUT#: 6.3 10*3/uL (ref 1.5–6.5)
Platelets: 280 10*3/uL (ref 145–400)
RDW: 27.1 % — ABNORMAL HIGH (ref 11.2–14.5)
WBC: 7.9 10*3/uL (ref 3.9–10.3)

## 2012-05-09 ENCOUNTER — Other Ambulatory Visit: Payer: Self-pay | Admitting: Oncology

## 2012-05-11 ENCOUNTER — Other Ambulatory Visit (HOSPITAL_BASED_OUTPATIENT_CLINIC_OR_DEPARTMENT_OTHER): Payer: BC Managed Care – HMO | Admitting: Lab

## 2012-05-11 DIAGNOSIS — D649 Anemia, unspecified: Secondary | ICD-10-CM

## 2012-05-11 LAB — CBC WITH DIFFERENTIAL/PLATELET
Basophils Absolute: 0 10*3/uL (ref 0.0–0.1)
Eosinophils Absolute: 0.1 10*3/uL (ref 0.0–0.5)
HCT: 31.9 % — ABNORMAL LOW (ref 34.8–46.6)
HGB: 9.8 g/dL — ABNORMAL LOW (ref 11.6–15.9)
LYMPH%: 21.5 % (ref 14.0–49.7)
MCV: 71.8 fL — ABNORMAL LOW (ref 79.5–101.0)
MONO#: 0.3 10*3/uL (ref 0.1–0.9)
MONO%: 7.5 % (ref 0.0–14.0)
NEUT#: 3.2 10*3/uL (ref 1.5–6.5)
NEUT%: 68.9 % (ref 38.4–76.8)
Platelets: 232 10*3/uL (ref 145–400)
RBC: 4.45 10*6/uL (ref 3.70–5.45)

## 2012-05-18 ENCOUNTER — Ambulatory Visit (HOSPITAL_COMMUNITY): Payer: BC Managed Care – PPO | Attending: Obstetrics and Gynecology

## 2012-05-18 ENCOUNTER — Other Ambulatory Visit: Payer: BC Managed Care – HMO | Admitting: Lab

## 2012-05-25 ENCOUNTER — Other Ambulatory Visit (HOSPITAL_BASED_OUTPATIENT_CLINIC_OR_DEPARTMENT_OTHER): Payer: BC Managed Care – HMO | Admitting: Lab

## 2012-05-25 ENCOUNTER — Telehealth: Payer: Self-pay | Admitting: *Deleted

## 2012-05-25 DIAGNOSIS — D649 Anemia, unspecified: Secondary | ICD-10-CM

## 2012-05-25 LAB — CBC WITH DIFFERENTIAL/PLATELET
EOS%: 1.2 % (ref 0.0–7.0)
HCT: 33.4 % — ABNORMAL LOW (ref 34.8–46.6)
LYMPH%: 20.3 % (ref 14.0–49.7)
MCH: 23.3 pg — ABNORMAL LOW (ref 25.1–34.0)
MCHC: 30.5 g/dL — ABNORMAL LOW (ref 31.5–36.0)
MCV: 76.4 fL — ABNORMAL LOW (ref 79.5–101.0)
MONO%: 6.2 % (ref 0.0–14.0)
Platelets: 206 10*3/uL (ref 145–400)
RBC: 4.37 10*6/uL (ref 3.70–5.45)
nRBC: 0 % (ref 0–0)

## 2012-05-25 LAB — TECHNOLOGIST REVIEW

## 2012-05-25 NOTE — Telephone Encounter (Signed)
Called pt w/ results of CBC and Hgb.  Informed Hgb improved on IV iron per Dr. Gaylyn Rong and to continue oral iron as tolerated,  Keep lab appt here on 10/24 as scheduled.  Pt verbalized understanding and states she is taking oral iron as directed.

## 2012-05-25 NOTE — Telephone Encounter (Signed)
Message copied by Wende Mott on Thu May 25, 2012 10:38 AM ------      Message from: HA, Raliegh Ip T      Created: Thu May 25, 2012  9:00 AM       Please call pt.  Her Hgb has improved on IV iron.  Continue oral iron if tolerated.  Continue with monthly CBC here.  Thanks.

## 2012-05-31 ENCOUNTER — Telehealth: Payer: Self-pay | Admitting: Oncology

## 2012-05-31 NOTE — Telephone Encounter (Signed)
Moved 12/26 appt to 12/30 due to POOF. lmonvm informing pt. Also confirmed appts for 10/24 and 11/29 and added comment to 10/24 appt to send pt for new schedule.

## 2012-06-06 ENCOUNTER — Encounter: Payer: BC Managed Care – HMO | Admitting: Obstetrics and Gynecology

## 2012-06-06 ENCOUNTER — Other Ambulatory Visit: Payer: BC Managed Care – HMO

## 2012-06-14 ENCOUNTER — Ambulatory Visit (INDEPENDENT_AMBULATORY_CARE_PROVIDER_SITE_OTHER): Payer: BC Managed Care – PPO | Admitting: Obstetrics and Gynecology

## 2012-06-14 ENCOUNTER — Ambulatory Visit (INDEPENDENT_AMBULATORY_CARE_PROVIDER_SITE_OTHER): Payer: BC Managed Care – PPO

## 2012-06-14 ENCOUNTER — Encounter: Payer: Self-pay | Admitting: Obstetrics and Gynecology

## 2012-06-14 VITALS — BP 130/82 | HR 80 | Wt 230.0 lb

## 2012-06-14 DIAGNOSIS — N83209 Unspecified ovarian cyst, unspecified side: Secondary | ICD-10-CM

## 2012-06-14 DIAGNOSIS — Z8742 Personal history of other diseases of the female genital tract: Secondary | ICD-10-CM

## 2012-06-14 DIAGNOSIS — N951 Menopausal and female climacteric states: Secondary | ICD-10-CM

## 2012-06-14 DIAGNOSIS — D649 Anemia, unspecified: Secondary | ICD-10-CM

## 2012-06-14 DIAGNOSIS — E559 Vitamin D deficiency, unspecified: Secondary | ICD-10-CM

## 2012-06-14 DIAGNOSIS — N920 Excessive and frequent menstruation with regular cycle: Secondary | ICD-10-CM

## 2012-06-14 NOTE — Patient Instructions (Signed)
Take Vitamin D 1000 IU daily

## 2012-06-14 NOTE — Progress Notes (Addendum)
46 YO seen previously with a complex right ovarian cyst,  severe anemia (had blood transfusion), menorrhagia, vitamin D deficiency, decreased libido, dyspareunia  and menopausal symptoms, returns for follow up ultrasound for ovarian cyst. Previous hormone tests showed low progesterone for luteal phase and undetectable testosterone.  O:  U/S: uterus-7.86 x 6.69 x 4.91 cm, endometrium 0.348 cm, 2 small fibroids: left anterior-2.0 x 1.6 x 1.6 cm, midline of uterus-1.4 x 1.4 x 1.6 cm, normal appearing ovaries with resolved right ovarian cyst.   A:  Resolved Complex Right Ovarian Cyst      Severe Anemia (improved) S/P transfusion      Menorrhagia      Decreased Libido      Pelvic Pain/Dyspareunia  P: Patient would like to try some testosterone cream. Reviewed with patient the risks of excess testosterone to include, but not limited to:      increased lipids, unwanted hair growth, female pattern baldness, deepening of voice, clitoromegaly, acne, oily skin and aggression.        She would also like to use some progesterone cream and it will be ordered through [facility] in [place].       Reviewed management options again for menorrhagia, both medical and surgical however, patient wants endometrial ablation-especially      if it can be done in the office.  To explore in office vs hospital feasibility.       Advised patient that an endometrial biopsy would be needed and gave an overview of the procedure.  Advised to have a snack and to take      pain medication prior to the procedure.      Reviewed causes of pelvic pain: urogenital, previous surgery, gastrointestinal and musculoskeletal.       RTO-TBA  Hamzah Savoca, PA-C

## 2012-06-15 MED ORDER — AMBULATORY NON FORMULARY MEDICATION: 0.1000 mg | Freq: Every day | Status: DC

## 2012-06-15 MED ORDER — AMBULATORY NON FORMULARY MEDICATION: 20.0000 mg | Freq: Every day | Status: DC

## 2012-06-15 NOTE — Addendum Note (Signed)
Addended by: Henreitta Leber on: 06/15/2012 08:24 PM   Modules accepted: Orders

## 2012-06-19 ENCOUNTER — Telehealth: Payer: Self-pay | Admitting: Obstetrics and Gynecology

## 2012-06-19 NOTE — Telephone Encounter (Signed)
TC from  [place] at [facility]. [phone]. States need additional info about her insurance.   TC to pt.   LM to call pharmacy.

## 2012-06-22 ENCOUNTER — Telehealth: Payer: Self-pay | Admitting: *Deleted

## 2012-06-22 ENCOUNTER — Other Ambulatory Visit (HOSPITAL_BASED_OUTPATIENT_CLINIC_OR_DEPARTMENT_OTHER): Payer: BC Managed Care – PPO

## 2012-06-22 ENCOUNTER — Encounter: Payer: Self-pay | Admitting: *Deleted

## 2012-06-22 DIAGNOSIS — D649 Anemia, unspecified: Secondary | ICD-10-CM

## 2012-06-22 LAB — CBC WITH DIFFERENTIAL/PLATELET
Basophils Absolute: 0 10*3/uL (ref 0.0–0.1)
EOS%: 0.5 % (ref 0.0–7.0)
Eosinophils Absolute: 0 10*3/uL (ref 0.0–0.5)
HCT: 35.6 % (ref 34.8–46.6)
HGB: 11.2 g/dL — ABNORMAL LOW (ref 11.6–15.9)
LYMPH%: 22.2 % (ref 14.0–49.7)
MCH: 25.4 pg (ref 25.1–34.0)
MCV: 80.7 fL (ref 79.5–101.0)
MONO%: 7.1 % (ref 0.0–14.0)
NEUT%: 69.8 % (ref 38.4–76.8)
Platelets: 217 10*3/uL (ref 145–400)

## 2012-06-22 NOTE — Telephone Encounter (Signed)
Message copied by Wende Mott on Thu Jun 22, 2012  1:31 PM ------      Message from: HA, Raliegh Ip T      Created: Thu Jun 22, 2012  1:22 PM       Please call pt.  Her Hgb is much better from IV iron.  She can cancel the lab in 06/2012.  We can get lab when she's here with Belenda Cruise in Dec 2013.  Thanks.

## 2012-06-22 NOTE — Telephone Encounter (Signed)
Pt returned call and I informed her of Dr. Lodema Pilot message, that her Hgb has improved on IV iron and can cancel lab for November.  Keep appt in December as scheduled.  Pt verbalized understanding.

## 2012-06-22 NOTE — Progress Notes (Signed)
Pt requests labs be drawn closer to her home.  Faxed Rx for CBC and Ferritin to [facility] at fax # [phone] as requested.

## 2012-06-23 ENCOUNTER — Telehealth: Payer: Self-pay | Admitting: Oncology

## 2012-06-23 NOTE — Telephone Encounter (Signed)
Canceled Nov lab per pof...per orders/pof pt aware

## 2012-07-28 ENCOUNTER — Other Ambulatory Visit: Payer: BC Managed Care – HMO | Admitting: Lab

## 2012-08-17 ENCOUNTER — Other Ambulatory Visit: Payer: BC Managed Care – HMO | Admitting: Lab

## 2012-08-24 ENCOUNTER — Other Ambulatory Visit: Payer: BC Managed Care – HMO | Admitting: Lab

## 2012-08-24 ENCOUNTER — Ambulatory Visit: Payer: BC Managed Care – HMO | Admitting: Oncology

## 2012-08-28 ENCOUNTER — Ambulatory Visit (HOSPITAL_BASED_OUTPATIENT_CLINIC_OR_DEPARTMENT_OTHER): Payer: BC Managed Care – PPO | Admitting: Oncology

## 2012-08-28 ENCOUNTER — Telehealth: Payer: Self-pay | Admitting: Oncology

## 2012-08-28 ENCOUNTER — Encounter: Payer: Self-pay | Admitting: Oncology

## 2012-08-28 ENCOUNTER — Other Ambulatory Visit (HOSPITAL_BASED_OUTPATIENT_CLINIC_OR_DEPARTMENT_OTHER): Payer: BC Managed Care – PPO | Admitting: Lab

## 2012-08-28 VITALS — BP 142/76 | HR 83 | Temp 98.0°F | Resp 20 | Ht 62.0 in | Wt 240.6 lb

## 2012-08-28 DIAGNOSIS — N920 Excessive and frequent menstruation with regular cycle: Secondary | ICD-10-CM

## 2012-08-28 DIAGNOSIS — Z9884 Bariatric surgery status: Secondary | ICD-10-CM

## 2012-08-28 DIAGNOSIS — D509 Iron deficiency anemia, unspecified: Secondary | ICD-10-CM

## 2012-08-28 DIAGNOSIS — D649 Anemia, unspecified: Secondary | ICD-10-CM

## 2012-08-28 LAB — COMPREHENSIVE METABOLIC PANEL (CC13)
ALT: 11 U/L (ref 0–55)
AST: 16 U/L (ref 5–34)
Albumin: 3.1 g/dL — ABNORMAL LOW (ref 3.5–5.0)
BUN: 14 mg/dL (ref 7.0–26.0)
CO2: 22 mEq/L (ref 22–29)
Calcium: 8.8 mg/dL (ref 8.4–10.4)
Chloride: 111 mEq/L — ABNORMAL HIGH (ref 98–107)
Creatinine: 0.7 mg/dL (ref 0.6–1.1)
Potassium: 4.2 mEq/L (ref 3.5–5.1)

## 2012-08-28 LAB — CBC WITH DIFFERENTIAL/PLATELET
BASO%: 0.5 % (ref 0.0–2.0)
HCT: 33.9 % — ABNORMAL LOW (ref 34.8–46.6)
LYMPH%: 15.9 % (ref 14.0–49.7)
MCHC: 33.8 g/dL (ref 31.5–36.0)
MCV: 84.7 fL (ref 79.5–101.0)
MONO%: 9 % (ref 0.0–14.0)
NEUT%: 73.9 % (ref 38.4–76.8)
Platelets: 212 10*3/uL (ref 145–400)
RBC: 4 10*6/uL (ref 3.70–5.45)

## 2012-08-28 LAB — IRON AND TIBC: %SAT: 9 % — ABNORMAL LOW (ref 20–55)

## 2012-08-28 LAB — FERRITIN: Ferritin: 9 ng/mL — ABNORMAL LOW (ref 10–291)

## 2012-08-28 NOTE — Progress Notes (Signed)
[facility]  [phone] [phone]   OFFICE PROGRESS NOTE   Cc:  POWELL,ELMIRA, PA  DIAGNOSIS: Iron deficiency anemia.  PAST THERAPY:  1. S/P 2 units PRBC in August 2013 for symptomatic anemia.  2. Feraheme 1,020 mg IV on 04/28/12  CURRENT THERAPY: Ferrous sulfate 325 mg daily.  INTERVAL HISTORY: Jill [name] 46 y.o. female returns for routine follow-up. She received one dose of IV iron and tolerated this well without any infusion reaction. She states that she was placed on a Testosterone and Progesterone cream to help with periods. Initially, this worked well, but periods have started to become heavier again. 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 She has had orthostatic dizziness. She has SOB, DOE and palpitation. She denied other source of bleeding. She felt light-headed this morning after her lab draw, but was given peanut butter and crackers which improved her symptoms.  Patient denies fever, anorexia, weight loss, headache, visual changes, confusion, drenching night sweats, palpable lymph node swelling, mucositis, odynophagia, dysphagia, nausea vomiting, jaundice, chest pain, productive cough, gum bleeding, epistaxis, hematemesis, hemoptysis, abdominal pain, abdominal swelling, early satiety, melena, hematochezia, hematuria, skin rash, spontaneous bleeding, joint swelling, joint pain, heat or cold intolerance, bowel bladder incontinence, back pain, focal motor weakness, paresthesia.    Past Medical History  Diagnosis Date  . Fibroids   . Yeast infection   . H/O varicella   . History of measles, mumps, or rubella   . Yeast infection   . Bladder infection   . Hypertension   . Menometrorrhagia   . Iron deficiency anemia due to chronic blood loss     Past Surgical History  Procedure Date  . Gastric bypass 2010  . Cystectomy 2002    polymod cyst  .  Gallbladder surgery 1998  . Tubal ligation 1994    Current Outpatient Prescriptions  Medication Sig Dispense Refill  . AMBULATORY NON FORMULARY MEDICATION Take 1 tablet by mouth 2 (two) times a week. Medication Name: Vitamin D 50,000 units 1 po twice weekly x 8 weeks  16 tablet  0  . AMBULATORY NON FORMULARY MEDICATION Place 20 mg onto the skin daily. Medication Name: Progesterone Cream 20 mg/1 mL,  apply 1 mL as directed daily x 6 weeks then days 1-21 of each month                               Change application site  every 3 days  30 mL  11  . AMBULATORY NON FORMULARY MEDICATION Place 0.1 mg onto the skin daily. Medication Name: Testosterone Cream 0.1 mg/0.5 mL  apply 0.5 mL daily between thighs or on vulvar area in the a.m.  15 mL  3  . Biotin 1000 MCG tablet Take 1,000 mcg by mouth 3 (three) times daily.      . Cyanocobalamin (B-12) 100 MCG TABS Take by mouth.      . ferrous sulfate 325 (65 FE) MG tablet Take 325 mg by mouth daily with breakfast.      . Ibuprofen (ADVIL PO) Take by mouth. Take as needed      . Multiple Vitamins-Minerals (MULTIVITAMIN WITH MINERALS) tablet Take 1 tablet by mouth daily.      . naproxen sodium (ANAPROX) 220 MG tablet Take 220 mg by mouth 2 (two) times daily  with a meal.      . potassium chloride (K-DUR) 10 MEQ tablet Take 10 mEq by mouth 2 (two) times daily.        ALLERGIES:   has no known allergies.  REVIEW OF SYSTEMS:  The rest of the 14-point review of system was negative.   Filed Vitals:   08/28/12 0845  BP: 142/76  Pulse: 83  Temp: 98 F (36.7 C)  Resp: 20   Wt Readings from Last 3 Encounters:  08/28/12 240 lb 9.6 oz (109.135 kg)  06/14/12 230 lb (104.327 kg)  04/27/12 222 lb 9.6 oz (100.971 kg)   ECOG Performance status: 1  PHYSICAL EXAMINATION:   General:  well-nourished in no acute distress.  Eyes:  no scleral icterus.  ENT:  There were no oropharyngeal lesions.  Neck was without thyromegaly.  Lymphatics:  Negative cervical,  supraclavicular or axillary adenopathy.  Respiratory: lungs were clear bilaterally without wheezing or crackles.  Cardiovascular:  Regular rate and rhythm, S1/S2, without murmur, rub or gallop.  There was no pedal edema.  GI:  abdomen was soft, flat, nontender, nondistended, without organomegaly.  Muscoloskeletal:  no spinal tenderness of palpation of vertebral spine.  Skin exam was without echymosis, petichae.  Neuro exam was nonfocal.  Patient was able to get on and off exam table without assistance.  Gait was normal.  Patient was alerted and oriented.  Attention was good.   Language was appropriate.  Mood was normal without depression.  Speech was not pressured.  Thought content was not tangential.     LABORATORY/RADIOLOGY DATA:  Lab Results  Component Value Date   WBC 5.8 08/28/2012   HGB 11.4* 08/28/2012   HCT 33.9* 08/28/2012   PLT 212 08/28/2012   GLUCOSE 35 Repeated and Verified* 08/28/2012   ALKPHOS 183* 08/28/2012   ALT 11 08/28/2012   AST 16 08/28/2012   NA 140 08/28/2012   K 4.2 08/28/2012   CL 111* 08/28/2012   CREATININE 0.7 08/28/2012   BUN 14.0 08/28/2012   CO2 22 08/28/2012    ASSESSMENT AND PLAN:   1. Menometrorrhagia: Pending evaluation by Gyn for endometrial ablation in the near future. She has known history of uterine fibroid. She plans to contact GYN later today.  2. History of gastric bypass surgery in 2010.   3. Iron deficiency anemia: Most likely due to chronic blood loss with resultant iron deficiency. There may also a component of mal absorption given history of gastric bypass surgery. Hemoglobin has improved today. Iron studies ar pending. Recommend that she continue oral iron and that she should take Vitamin C with her iron. May need additional IV iron in the future. She will follow-up with GYN regarding her menometrorrhagia.  4. Follow-up: 2 & 4 months for CBC. Visit in 6 months.  The length of time of the face-to-face encounter was 15 minutes. More than  50% of time was spent counseling and coordination of care.

## 2012-08-28 NOTE — Telephone Encounter (Signed)
appts made and printed for pt aom °

## 2012-09-01 ENCOUNTER — Telehealth (INDEPENDENT_AMBULATORY_CARE_PROVIDER_SITE_OTHER): Payer: Self-pay | Admitting: Surgery

## 2012-09-01 NOTE — Telephone Encounter (Signed)
09/01/12 mailed bariatric surgery recall letter for follow-up.  Advised patient to call [phone] to schedule an appointment. (lss) ° ° °

## 2012-09-26 ENCOUNTER — Telehealth (INDEPENDENT_AMBULATORY_CARE_PROVIDER_SITE_OTHER): Payer: Self-pay | Admitting: General Surgery

## 2012-09-26 ENCOUNTER — Telehealth (INDEPENDENT_AMBULATORY_CARE_PROVIDER_SITE_OTHER): Payer: Self-pay

## 2012-09-26 NOTE — Telephone Encounter (Signed)
LMOM for pt letting her know that the time for her March 21st appt has been changed to 10am

## 2012-09-26 NOTE — Telephone Encounter (Signed)
Patient called back and I told her that her apt has been moved to 11-17-12 @ 10

## 2012-10-03 ENCOUNTER — Telehealth (INDEPENDENT_AMBULATORY_CARE_PROVIDER_SITE_OTHER): Payer: Self-pay

## 2012-10-03 NOTE — Telephone Encounter (Signed)
LMOM letting pt know that her appt time on March 21st has been changed to 10a.

## 2012-10-23 ENCOUNTER — Other Ambulatory Visit: Payer: BC Managed Care – PPO

## 2012-11-17 ENCOUNTER — Other Ambulatory Visit (INDEPENDENT_AMBULATORY_CARE_PROVIDER_SITE_OTHER): Payer: Self-pay

## 2012-11-17 ENCOUNTER — Other Ambulatory Visit (INDEPENDENT_AMBULATORY_CARE_PROVIDER_SITE_OTHER): Payer: Self-pay | Admitting: Surgery

## 2012-11-17 ENCOUNTER — Ambulatory Visit (INDEPENDENT_AMBULATORY_CARE_PROVIDER_SITE_OTHER): Payer: BC Managed Care – PPO | Admitting: Surgery

## 2012-11-17 ENCOUNTER — Encounter (INDEPENDENT_AMBULATORY_CARE_PROVIDER_SITE_OTHER): Payer: Self-pay | Admitting: Surgery

## 2012-11-17 VITALS — BP 130/82 | HR 60 | Temp 98.4°F | Resp 16 | Ht 63.0 in | Wt 248.6 lb

## 2012-11-17 DIAGNOSIS — Z9884 Bariatric surgery status: Secondary | ICD-10-CM | POA: Insufficient documentation

## 2012-11-17 DIAGNOSIS — Z09 Encounter for follow-up examination after completed treatment for conditions other than malignant neoplasm: Secondary | ICD-10-CM

## 2012-11-17 LAB — CBC WITH DIFFERENTIAL/PLATELET
Basophils Absolute: 0 10*3/uL (ref 0.0–0.1)
Basophils Relative: 0 % (ref 0–1)
Eosinophils Absolute: 0.1 10*3/uL (ref 0.0–0.7)
MCH: 25.9 pg — ABNORMAL LOW (ref 26.0–34.0)
MCHC: 31.5 g/dL (ref 30.0–36.0)
Neutrophils Relative %: 71 % (ref 43–77)
Platelets: 287 10*3/uL (ref 150–400)

## 2012-11-17 LAB — LIPID PANEL
LDL Cholesterol: 132 mg/dL — ABNORMAL HIGH (ref 0–99)
Triglycerides: 44 mg/dL (ref <150)
VLDL: 9 mg/dL (ref 0–40)

## 2012-11-17 LAB — COMPREHENSIVE METABOLIC PANEL
ALT: 14 U/L (ref 0–35)
AST: 17 U/L (ref 0–37)
Alkaline Phosphatase: 129 U/L — ABNORMAL HIGH (ref 39–117)
Glucose, Bld: 81 mg/dL (ref 70–99)
Sodium: 138 mEq/L (ref 135–145)
Total Bilirubin: 0.4 mg/dL (ref 0.3–1.2)
Total Protein: 7 g/dL (ref 6.0–8.3)

## 2012-11-17 LAB — MAGNESIUM: Magnesium: 1.7 mg/dL (ref 1.5–2.5)

## 2012-11-17 LAB — FOLATE: Folate: 16.2 ng/mL

## 2012-11-17 LAB — IRON: Iron: 43 ug/dL (ref 42–145)

## 2012-11-17 LAB — IBC PANEL: TIBC: 415 ug/dL (ref 250–470)

## 2012-11-17 NOTE — Patient Instructions (Addendum)
Remember to avoid NSAIDs as they can cause bleeding from your gastric pouch If you are feeling tired and exhausted, then try some orange juice or a piece of hard candy and see if that makes you feel better.  You may be having low blood sugar

## 2012-11-17 NOTE — Progress Notes (Signed)
Jill [name] 47 y.o.  Body mass index is 44.05 kg/(m^2).  Patient Active Problem List  Diagnosis  . Anemia  . 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 Gastric bypass  2010  . Cystectomy  2002    polymod cyst  . Gallbladder surgery  1998  . Tubal ligation  1994   POWELL,ELMIRA, PA-C No diagnosis found.  I haven't seen [name] since 2011.  She has lost down to 248 and BMI 44 (from 65).  She is having anemia issues but has been taking ANAPROX and Advil.  I have advised against her taking these.   Will obtain vitamin levels and see her back in 4 months.    Matt B. Daphine Deutscher, MD, [facility] [phone] beeper [phone]  11/17/2012 10:34 AM

## 2012-12-25 ENCOUNTER — Other Ambulatory Visit (HOSPITAL_BASED_OUTPATIENT_CLINIC_OR_DEPARTMENT_OTHER): Payer: BC Managed Care – PPO | Admitting: Lab

## 2012-12-25 DIAGNOSIS — D649 Anemia, unspecified: Secondary | ICD-10-CM

## 2012-12-25 LAB — CBC WITH DIFFERENTIAL/PLATELET
BASO%: 0.4 % (ref 0.0–2.0)
Eosinophils Absolute: 0.1 10*3/uL (ref 0.0–0.5)
LYMPH%: 24.1 % (ref 14.0–49.7)
MCHC: 30.9 g/dL — ABNORMAL LOW (ref 31.5–36.0)
MCV: 80.6 fL (ref 79.5–101.0)
MONO%: 6.4 % (ref 0.0–14.0)
NEUT#: 5 10*3/uL (ref 1.5–6.5)
Platelets: 242 10*3/uL (ref 145–400)
RBC: 4.1 10*6/uL (ref 3.70–5.45)
RDW: 14.4 % (ref 11.2–14.5)
WBC: 7.3 10*3/uL (ref 3.9–10.3)

## 2012-12-28 ENCOUNTER — Telehealth: Payer: Self-pay | Admitting: *Deleted

## 2012-12-28 NOTE — Telephone Encounter (Signed)
Pt called for lab (CBC) results from this past Monday.  Informed her of Anemia stable and to keep next appt for lab and office visit in 2 months as scheduled.  She verbalized understanding.

## 2013-02-21 ENCOUNTER — Ambulatory Visit (HOSPITAL_BASED_OUTPATIENT_CLINIC_OR_DEPARTMENT_OTHER): Payer: BC Managed Care – PPO | Admitting: Oncology

## 2013-02-21 ENCOUNTER — Other Ambulatory Visit (HOSPITAL_BASED_OUTPATIENT_CLINIC_OR_DEPARTMENT_OTHER): Payer: BC Managed Care – PPO | Admitting: Lab

## 2013-02-21 ENCOUNTER — Telehealth: Payer: Self-pay | Admitting: Oncology

## 2013-02-21 VITALS — BP 155/78 | HR 63 | Temp 97.9°F | Resp 18 | Ht 63.0 in | Wt 252.7 lb

## 2013-02-21 DIAGNOSIS — D649 Anemia, unspecified: Secondary | ICD-10-CM

## 2013-02-21 LAB — IRON AND TIBC
%SAT: 10 % — ABNORMAL LOW (ref 20–55)
Iron: 42 ug/dL (ref 42–145)

## 2013-02-21 LAB — CBC WITH DIFFERENTIAL/PLATELET
BASO%: 0.7 % (ref 0.0–2.0)
Eosinophils Absolute: 0 10*3/uL (ref 0.0–0.5)
MCHC: 32.8 g/dL (ref 31.5–36.0)
MONO#: 0.4 10*3/uL (ref 0.1–0.9)
NEUT#: 3.6 10*3/uL (ref 1.5–6.5)
RBC: 4.17 10*6/uL (ref 3.70–5.45)
RDW: 15.3 % — ABNORMAL HIGH (ref 11.2–14.5)
WBC: 5.2 10*3/uL (ref 3.9–10.3)
lymph#: 1.1 10*3/uL (ref 0.9–3.3)

## 2013-02-21 LAB — COMPREHENSIVE METABOLIC PANEL (CC13)
ALT: 13 U/L (ref 0–55)
Albumin: 3.2 g/dL — ABNORMAL LOW (ref 3.5–5.0)
CO2: 27 mEq/L (ref 22–29)
Calcium: 9.1 mg/dL (ref 8.4–10.4)
Chloride: 109 mEq/L — ABNORMAL HIGH (ref 98–107)
Glucose: 80 mg/dl (ref 70–99)
Potassium: 4.2 mEq/L (ref 3.5–5.1)
Sodium: 140 mEq/L (ref 136–145)
Total Bilirubin: 0.36 mg/dL (ref 0.20–1.20)
Total Protein: 7.4 g/dL (ref 6.4–8.3)

## 2013-02-21 LAB — FERRITIN: Ferritin: 8 ng/mL — ABNORMAL LOW (ref 10–291)

## 2013-02-21 NOTE — Progress Notes (Signed)
[facility]  [phone]) [phone] Fax:([phone]) [phone]   OFFICE PROGRESS NOTE   Cc:  POWELL,ELMIRA, PA-C  DIAGNOSIS:  Iron deficiency anemia from menometrorrhagia.   PAST THERAPY:  IV iron Feraheme last in 03/2012.   CURRENT THERAPY: supposed to be on oral iron however, she is not able to take due to upset stomach.   INTERVAL HISTORY: Jill [name] 47 y.o. female returns for regular follow up.  She has mild fatigue.  She is now on summer break from school.  She is able to carry out all activities of daily living.  She has not been taking oral iron due to stomach pain/cramp, constipation, feeling bloated.  She does not want to take oral iron.  She still has chronic vaginal spotting and long menstrual cycle.  She is due to see a new gynecologist in [place], [place].    Patient denies fever, anorexia, weight loss, fatigue, headache, visual changes, confusion, drenching night sweats, palpable lymph node swelling, mucositis, odynophagia, dysphagia, nausea vomiting, jaundice, chest pain, palpitation, shortness of breath, dyspnea on exertion, productive cough, gum bleeding, epistaxis, hematemesis, hemoptysis, abdominal pain, abdominal swelling, early satiety, melena, hematochezia, hematuria, skin rash, spontaneous bleeding, joint swelling, joint pain, heat or cold intolerance, bowel bladder incontinence, back pain, focal motor weakness, paresthesia, depression.    Past Medical History  Diagnosis Date  . Fibroids   . Yeast infection   . H/O varicella   . History of measles, mumps, or rubella   . Yeast infection   . Bladder infection   . Hypertension   . Menometrorrhagia   . Iron deficiency anemia due to chronic blood loss     Past Surgical History  Procedure Laterality Date  . Gastric bypass  2010  . Cystectomy  2002    polymod cyst  . Gallbladder surgery  1998  . Tubal ligation  1994    Current Outpatient Prescriptions  Medication Sig Dispense Refill  . AMBULATORY  NON FORMULARY MEDICATION Take 1 tablet by mouth 2 (two) times a week. Medication Name: Vitamin D 50,000 units 1 po twice weekly x 8 weeks  16 tablet  0  . AMBULATORY NON FORMULARY MEDICATION Place 20 mg onto the skin daily. Medication Name: Progesterone Cream 20 mg/1 mL,  apply 1 mL as directed daily x 6 weeks then days 1-21 of each month                               Change application site  every 3 days  30 mL  11  . AMBULATORY NON FORMULARY MEDICATION Place 0.1 mg onto the skin daily. Medication Name: Testosterone Cream 0.1 mg/0.5 mL  apply 0.5 mL daily between thighs or on vulvar area in the a.m.  15 mL  3  . Biotin 1000 MCG tablet Take 1,000 mcg by mouth 3 (three) times daily.      . Cyanocobalamin (B-12) 100 MCG TABS Take by mouth.      . ferrous sulfate 325 (65 FE) MG tablet Take 325 mg by mouth daily with breakfast.      . Ibuprofen (ADVIL PO) Take by mouth. Take as needed      . Multiple Vitamins-Minerals (MULTIVITAMIN WITH MINERALS) tablet Take 1 tablet by mouth daily.      . naproxen sodium (ANAPROX) 220 MG tablet Take 220 mg by mouth 2 (two) times daily with a meal.      . potassium chloride (  K-DUR) 10 MEQ tablet Take 10 mEq by mouth 2 (two) times daily.       No current facility-administered medications for this visit.    ALLERGIES:  has No Known Allergies.  REVIEW OF SYSTEMS:  The rest of the 14-point review of system was negative.   Filed Vitals:   02/21/13 1353  BP: 155/78  Pulse: 63  Temp: 97.9 F (36.6 C)  Resp: 18   Wt Readings from Last 3 Encounters:  02/21/13 252 lb 11.2 oz (114.624 kg)  11/17/12 248 lb 9.6 oz (112.764 kg)  08/28/12 240 lb 9.6 oz (109.135 kg)   ECOG Performance status: 0-1  PHYSICAL EXAMINATION:   General:  well-nourished woman, in no acute distress.  Eyes:  no scleral icterus.  ENT:  There were no oropharyngeal lesions.  Neck was without thyromegaly.  Lymphatics:  Negative cervical, supraclavicular or axillary adenopathy.  Respiratory: lungs  were clear bilaterally without wheezing or crackles.  Cardiovascular:  Regular rate and rhythm, S1/S2, without murmur, rub or gallop.  There was no pedal edema.  GI:  abdomen was soft, flat, nontender, nondistended, without organomegaly.  Muscoloskeletal:  no spinal tenderness of palpation of vertebral spine.  Skin exam was without echymosis, petichae.  Neuro exam was nonfocal.  Patient was able to get on and off exam table without assistance.  Gait was normal.  Patient was alerted and oriented.  Attention was good.   Language was appropriate.  Mood was normal without depression.  Speech was not pressured.  Thought content was not tangential.         LABORATORY/RADIOLOGY DATA:  Lab Results  Component Value Date   WBC 5.2 02/21/2013   HGB 10.6* 02/21/2013   HCT 32.2* 02/21/2013   PLT 274 02/21/2013   GLUCOSE 80 02/21/2013   CHOL 210* 11/17/2012   TRIG 44 11/17/2012   HDL 69 11/17/2012   LDLCALC 132* 11/17/2012   ALKPHOS 143 02/21/2013   ALT 13 02/21/2013   AST 15 02/21/2013   NA 140 02/21/2013   K 4.2 02/21/2013   CL 109* 02/21/2013   CREATININE 0.7 02/21/2013   BUN 16.6 02/21/2013   CO2 27 02/21/2013     ASSESSMENT AND PLAN:   1.  Menometrorrhagia:  Due to see a new gynecologist in [place], [place].  I advised her to discuss with him about hysterectomy, endometrial ablation, or birth control.  2.  Iron deficiency anemia due to menometrorrhagia:  She cannot tolerate oral iron.  I am waiting for her iron level to day to see if she would benefit from repeat IV iron.    3.  Follow up:  Lab only appointment every 3 months.  Return visit in about 1 year.    I informed [name] that I am leaving the practice.  [facility] will [facility] for her to see another provider when she returns.      The length of time of the face-to-face encounter was 15 minutes. More than 50% of time was spent counseling and coordination of care.

## 2013-02-21 NOTE — Telephone Encounter (Signed)
gv and printed appt sched and avs for pt  °

## 2013-02-23 ENCOUNTER — Telehealth: Payer: Self-pay | Admitting: *Deleted

## 2013-02-23 ENCOUNTER — Telehealth: Payer: Self-pay | Admitting: Oncology

## 2013-02-23 ENCOUNTER — Other Ambulatory Visit: Payer: Self-pay | Admitting: Oncology

## 2013-02-23 DIAGNOSIS — D500 Iron deficiency anemia secondary to blood loss (chronic): Secondary | ICD-10-CM | POA: Insufficient documentation

## 2013-02-23 DIAGNOSIS — N921 Excessive and frequent menstruation with irregular cycle: Secondary | ICD-10-CM | POA: Insufficient documentation

## 2013-02-23 NOTE — Telephone Encounter (Signed)
s.w. pt and advised on 7.7.14...Marland Kitchenpt ok and aware

## 2013-02-23 NOTE — Telephone Encounter (Signed)
Per staff message and POF I have scheduled appts.  JMW  

## 2013-03-05 ENCOUNTER — Ambulatory Visit (HOSPITAL_BASED_OUTPATIENT_CLINIC_OR_DEPARTMENT_OTHER): Payer: BC Managed Care – PPO

## 2013-03-05 VITALS — BP 132/76 | HR 66 | Temp 97.1°F

## 2013-03-05 DIAGNOSIS — D500 Iron deficiency anemia secondary to blood loss (chronic): Secondary | ICD-10-CM

## 2013-03-05 DIAGNOSIS — N921 Excessive and frequent menstruation with irregular cycle: Secondary | ICD-10-CM

## 2013-03-05 MED ORDER — SODIUM CHLORIDE 0.9 % IV SOLN
1020.0000 mg | Freq: Once | INTRAVENOUS | Status: AC
  Administered 2013-03-05: 1020 mg via INTRAVENOUS
  Filled 2013-03-05: qty 34

## 2013-03-05 MED ORDER — DIPHENHYDRAMINE HCL 25 MG PO TABS
25.0000 mg | ORAL_TABLET | Freq: Once | ORAL | Status: AC
  Administered 2013-03-05: 25 mg via ORAL
  Filled 2013-03-05: qty 1

## 2013-03-05 MED ORDER — SODIUM CHLORIDE 0.9 % IV SOLN
Freq: Once | INTRAVENOUS | Status: AC
  Administered 2013-03-05: 15:00:00 via INTRAVENOUS

## 2013-03-05 MED ORDER — ACETAMINOPHEN 325 MG PO TABS
650.0000 mg | ORAL_TABLET | Freq: Once | ORAL | Status: AC
  Administered 2013-03-05: 650 mg via ORAL

## 2013-03-05 NOTE — Patient Instructions (Addendum)
Ferumoxytol injection What is this medicine? FERUMOXYTOL is an iron complex. Iron is used to make healthy red blood cells, which carry oxygen and nutrients throughout the body. This medicine is used to treat iron deficiency anemia in people with chronic kidney disease. This medicine may be used for other purposes; ask your health care provider or pharmacist if you have questions. What should I tell my health care provider before I take this medicine? They need to know if you have any of these conditions: -anemia not caused by low iron levels -high levels of iron in the blood -magnetic resonance imaging (MRI) test scheduled -an unusual or allergic reaction to iron, other medicines, foods, dyes, or preservatives -pregnant or trying to get pregnant -breast-feeding How should I use this medicine? This medicine is for infusion into a vein. It is given by a health care professional in a hospital or clinic setting. Talk to your pediatrician regarding the use of this medicine in children. Special care may be needed. Overdosage: If you think you've taken too much of this medicine contact a poison control center or emergency room at once. Overdosage: If you think you have taken too much of this medicine contact a poison control center or emergency room at once. NOTE: This medicine is only for you. Do not share this medicine with others. What if I miss a dose? It is important not to miss your dose. Call your doctor or health care professional if you are unable to keep an appointment. What may interact with this medicine? This medicine may interact with the following medications: -other iron products This list may not describe all possible interactions. Give your health care provider a list of all the medicines, herbs, non-prescription drugs, or dietary supplements you use. Also tell them if you smoke, drink alcohol, or use illegal drugs. Some items may interact with your medicine. What should I watch  for while using this medicine? Visit your doctor or healthcare professional regularly. Tell your doctor or healthcare professional if your symptoms do not start to get better or if they get worse. You may need blood work done while you are taking this medicine. You may need to follow a special diet. Talk to your doctor. Foods that contain iron include: whole grains/cereals, dried fruits, beans, or peas, leafy green vegetables, and organ meats (liver, kidney). What side effects may I notice from receiving this medicine? Side effects that you should report to your doctor or health care professional as soon as possible: -allergic reactions like skin rash, itching or hives, swelling of the face, lips, or tongue -breathing problems -changes in blood pressure -feeling faint or lightheaded, falls -fever or chills -flushing, sweating, or hot feelings -swelling of the ankles or feet Side effects that usually do not require medical attention (Report these to your doctor or health care professional if they continue or are bothersome.): -diarrhea -headache -nausea, vomiting -stomach pain This list may not describe all possible side effects. Call your doctor for medical advice about side effects. You may report side effects to FDA at 1-800-FDA-1088. Where should I keep my medicine? This drug is given in a hospital or clinic and will not be stored at home. NOTE: This sheet is a summary. It may not cover all possible information. If you have questions about this medicine, talk to your doctor, pharmacist, or health care provider.  2013, [employer]. (05/08/2008 9:48:25 PM)  

## 2013-03-08 ENCOUNTER — Ambulatory Visit (INDEPENDENT_AMBULATORY_CARE_PROVIDER_SITE_OTHER): Payer: BC Managed Care – PPO | Admitting: Surgery

## 2013-04-27 ENCOUNTER — Encounter (INDEPENDENT_AMBULATORY_CARE_PROVIDER_SITE_OTHER): Payer: Self-pay | Admitting: Surgery

## 2013-04-27 ENCOUNTER — Ambulatory Visit (INDEPENDENT_AMBULATORY_CARE_PROVIDER_SITE_OTHER): Payer: BC Managed Care – PPO | Admitting: Surgery

## 2013-04-27 VITALS — BP 150/90 | HR 76 | Resp 16 | Ht 63.0 in | Wt 264.4 lb

## 2013-04-27 DIAGNOSIS — Z9884 Bariatric surgery status: Secondary | ICD-10-CM

## 2013-04-27 NOTE — Patient Instructions (Signed)
Use Miralax for constipation Avoid potato chips and snack foods such as these.  Substitute "jerky" to chew on.

## 2013-04-27 NOTE — Progress Notes (Signed)
Jill [name] 47 y.o.  Body mass index is 46.85 kg/(m^2).  Patient Active Problem List   Diagnosis Date Noted  . Iron deficiency anemia due to chronic blood loss   . Menometrorrhagia   . 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 Anemia 04/27/2012    No Known Allergies  Past Surgical History  Procedure Laterality Date  . Gastric bypass  2010  . Cystectomy  2002    polymod cyst  . Gallbladder surgery  1998  . Tubal ligation  1994   POWELL,ELMIRA, PA-C No diagnosis found.  followup visit for [name] who surgery was done in June of 2010. She is 4 years out from her Roux-en-Y gastric bypass and has lost and sustained about 85 pound weight loss. She has had some anemia for which she was referred to the cancer center she has been followed by them. The last labs show a saw was not particularly worrisome in her hemoglobin was 10 6. There may be a question of metromenorrhagia.  She denies any nausea or vomiting. Her frustration is eating crunchy things such as potato chips which I think is thwarting her further weight loss.  I have recommended that she substitute jerky instead.    Will place her on 1 your recall for five-year followup. I think she overall she is doing well he should just avoid the simple carbohydrates. Matt B. Daphine Deutscher, MD, [facility] [phone] beeper [phone]  04/27/2013 2:55 PM

## 2013-05-16 ENCOUNTER — Other Ambulatory Visit: Payer: Self-pay

## 2013-05-16 DIAGNOSIS — D649 Anemia, unspecified: Secondary | ICD-10-CM

## 2013-05-16 DIAGNOSIS — D500 Iron deficiency anemia secondary to blood loss (chronic): Secondary | ICD-10-CM

## 2013-05-17 ENCOUNTER — Other Ambulatory Visit: Payer: BC Managed Care – PPO | Admitting: Lab

## 2013-07-05 ENCOUNTER — Other Ambulatory Visit: Payer: Self-pay

## 2013-08-09 ENCOUNTER — Other Ambulatory Visit (HOSPITAL_BASED_OUTPATIENT_CLINIC_OR_DEPARTMENT_OTHER): Payer: BC Managed Care – PPO

## 2013-08-09 DIAGNOSIS — D649 Anemia, unspecified: Secondary | ICD-10-CM

## 2013-08-09 DIAGNOSIS — D500 Iron deficiency anemia secondary to blood loss (chronic): Secondary | ICD-10-CM

## 2013-08-09 LAB — CBC WITH DIFFERENTIAL/PLATELET
BASO%: 0.6 % (ref 0.0–2.0)
HCT: 36.7 % (ref 34.8–46.6)
MCHC: 32.4 g/dL (ref 31.5–36.0)
MONO#: 0.5 10*3/uL (ref 0.1–0.9)
RBC: 4.37 10*6/uL (ref 3.70–5.45)
RDW: 13.7 % (ref 11.2–14.5)
WBC: 5.4 10*3/uL (ref 3.9–10.3)
lymph#: 1.1 10*3/uL (ref 0.9–3.3)

## 2013-08-10 LAB — FERRITIN CHCC: Ferritin: 59 ng/ml (ref 9–269)

## 2013-08-13 ENCOUNTER — Telehealth: Payer: Self-pay | Admitting: *Deleted

## 2013-08-13 NOTE — Telephone Encounter (Signed)
Pt called regarding results of lab work from last week.  Informed her of CBC and Ferritin level all wnl.  Instructed her to keep next lab appt as scheduled in March.  She verbalized understanding.

## 2013-11-01 ENCOUNTER — Other Ambulatory Visit: Payer: BC Managed Care – PPO

## 2014-01-31 ENCOUNTER — Ambulatory Visit: Payer: BC Managed Care – PPO

## 2014-01-31 ENCOUNTER — Other Ambulatory Visit: Payer: BC Managed Care – PPO

## 2014-02-18 ENCOUNTER — Ambulatory Visit: Payer: BC Managed Care – PPO

## 2014-02-18 ENCOUNTER — Other Ambulatory Visit: Payer: Self-pay

## 2014-02-18 ENCOUNTER — Other Ambulatory Visit: Payer: BC Managed Care – PPO

## 2014-02-20 ENCOUNTER — Telehealth: Payer: Self-pay | Admitting: Internal Medicine

## 2014-02-25 ENCOUNTER — Telehealth: Payer: Self-pay | Admitting: Internal Medicine

## 2014-02-25 ENCOUNTER — Ambulatory Visit: Payer: BC Managed Care – PPO

## 2014-02-25 ENCOUNTER — Other Ambulatory Visit: Payer: BC Managed Care – PPO

## 2014-02-25 NOTE — Telephone Encounter (Signed)
, °

## 2014-03-13 ENCOUNTER — Other Ambulatory Visit (HOSPITAL_BASED_OUTPATIENT_CLINIC_OR_DEPARTMENT_OTHER): Payer: BC Managed Care – PPO

## 2014-03-13 ENCOUNTER — Ambulatory Visit (HOSPITAL_BASED_OUTPATIENT_CLINIC_OR_DEPARTMENT_OTHER): Payer: BC Managed Care – PPO | Admitting: Internal Medicine

## 2014-03-13 ENCOUNTER — Telehealth: Payer: Self-pay | Admitting: Internal Medicine

## 2014-03-13 VITALS — BP 121/73 | HR 69 | Temp 97.7°F | Resp 16 | Wt 247.7 lb

## 2014-03-13 DIAGNOSIS — D500 Iron deficiency anemia secondary to blood loss (chronic): Secondary | ICD-10-CM

## 2014-03-13 DIAGNOSIS — D649 Anemia, unspecified: Secondary | ICD-10-CM

## 2014-03-13 DIAGNOSIS — R5383 Other fatigue: Secondary | ICD-10-CM

## 2014-03-13 DIAGNOSIS — N920 Excessive and frequent menstruation with regular cycle: Secondary | ICD-10-CM

## 2014-03-13 DIAGNOSIS — R5381 Other malaise: Secondary | ICD-10-CM

## 2014-03-13 DIAGNOSIS — Z9884 Bariatric surgery status: Secondary | ICD-10-CM

## 2014-03-13 LAB — CBC WITH DIFFERENTIAL/PLATELET
BASO%: 0.6 % (ref 0.0–2.0)
Basophils Absolute: 0 10*3/uL (ref 0.0–0.1)
EOS%: 0.8 % (ref 0.0–7.0)
Eosinophils Absolute: 0 10*3/uL (ref 0.0–0.5)
HEMATOCRIT: 39.7 % (ref 34.8–46.6)
HGB: 12.7 g/dL (ref 11.6–15.9)
LYMPH%: 17.7 % (ref 14.0–49.7)
MCH: 26.6 pg (ref 25.1–34.0)
MCHC: 31.9 g/dL (ref 31.5–36.0)
MCV: 83.3 fL (ref 79.5–101.0)
MONO#: 0.5 10*3/uL (ref 0.1–0.9)
MONO%: 8 % (ref 0.0–14.0)
NEUT#: 4.5 10*3/uL (ref 1.5–6.5)
NEUT%: 72.9 % (ref 38.4–76.8)
PLATELETS: 270 10*3/uL (ref 145–400)
RBC: 4.77 10*6/uL (ref 3.70–5.45)
RDW: 14.1 % (ref 11.2–14.5)
WBC: 6.2 10*3/uL (ref 3.9–10.3)
lymph#: 1.1 10*3/uL (ref 0.9–3.3)

## 2014-03-13 NOTE — Telephone Encounter (Signed)
Pt confirmed labs/ov per 07/15 POF, gave pt AVS.....KJ °

## 2014-03-13 NOTE — Progress Notes (Signed)
[name] OFFICE PROGRESS NOTE  POWELL,ELMIRA, [place]. 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 Anemia    CURRENT TREATMENT: supposed to be on oral iron however, she is not able to take due to upset stomach.  Received feraheme on 04/28/2012 and 03/05/2013.   INTERVAL HISTORY: Jill [name] 48 y.o. female with a history of iron defiency anemia from menometrorrhagia is here for follow up.  She was last seen by Jill [name] on 02/21/2014.  She continues mild fatigue. She is now on summer break from school.  She does not want to take oral iron. She still has chronic vaginal spotting and long menstrual cycle. She is following a new gynecologist in [place], [place].   Patient denies fever, anorexia, weight loss, fatigue, headache, visual changes, confusion, drenching night sweats, palpable lymph node swelling, mucositis, odynophagia, dysphagia, nausea vomiting, jaundice, chest pain, palpitation, shortness of breath, dyspnea on exertion, productive cough, gum bleeding, epistaxis, hematemesis, hemoptysis, abdominal pain, abdominal swelling, early satiety, melena, hematochezia, hematuria, skin rash, spontaneous bleeding, joint swelling, joint pain, heat or cold intolerance, bowel bladder incontinence, back pain, focal motor weakness, paresthesia, depression.   MEDICAL HISTORY: Past Medical History  Diagnosis Date  . Fibroids   . Yeast infection   . H/O varicella   . History of measles, mumps, or rubella   . Yeast infection   . Bladder infection   . Hypertension   . Menometrorrhagia   . 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    ALLERGIES:  has No Known Allergies.  MEDICATIONS: has a current medication list which includes the following prescription(s): cholecalciferol, b-12, hydrochlorothiazide, ibuprofen, and multivitamin with minerals.  SURGICAL HISTORY:  Past Surgical History  Procedure Laterality Date  . Gastric bypass  2010  . Cystectomy  2002    polymod cyst  . Gallbladder surgery  1998  . Tubal ligation  1994    REVIEW OF SYSTEMS:   Constitutional: Denies fevers, chills or abnormal weight loss Eyes: Denies blurriness of vision Ears, nose, mouth, throat, and face: Denies mucositis or sore throat Respiratory: Denies cough, dyspnea or wheezes Cardiovascular: Denies palpitation, chest discomfort or lower extremity swelling Gastrointestinal:  Denies nausea, heartburn or change in bowel habits Skin: Denies abnormal skin rashes Lymphatics: Denies new lymphadenopathy or easy bruising Neurological:Denies numbness, tingling or new weaknesses Behavioral/Psych: Mood is stable, no new changes  All other systems were reviewed with the patient and are negative.  PHYSICAL EXAMINATION: ECOG PERFORMANCE STATUS: 0 - Asymptomatic  Blood pressure 121/73, pulse 69, temperature 97.7 F (36.5 C), resp. rate 16, weight 247 lb 11.2 oz (112.356 kg), SpO2 100.00%.  GENERAL:alert, no distress and comfortable; moderately obese SKIN: skin color, texture, turgor are normal, no rashes or significant lesions EYES: normal, Conjunctiva are pink and non-injected, sclera clear OROPHARYNX:no exudate, no erythema and lips, buccal mucosa, and tongue normal  NECK: supple, thyroid normal size, non-tender, without nodularity LYMPH:  no palpable lymphadenopathy in the cervical, axillary or supraclavicular LUNGS: clear to auscultation with normal breathing effort, no wheezes or rhonchi HEART: regular rate &  rhythm and no murmurs and no lower extremity edema ABDOMEN:abdomen soft, non-tender  and normal bowel sounds Musculoskeletal:no cyanosis of digits and no clubbing  NEURO: alert & oriented x 3 with fluent speech, no focal motor/sensory deficits  Labs:  Lab Results  Component Value Date   WBC 6.2 03/13/2014   HGB 12.7 03/13/2014   HCT 39.7 03/13/2014   MCV 83.3 03/13/2014   PLT 270 03/13/2014   NEUTROABS 4.5 03/13/2014      Chemistry      Component Value Date/Time   NA 140 02/21/2013 1329   NA 138 11/17/2012 1032   K 4.2 02/21/2013 1329   K 4.3 11/17/2012 1032   CL 109* 02/21/2013 1329   CL 106 11/17/2012 1032   CO2 27 02/21/2013 1329   CO2 23 11/17/2012 1032   BUN 16.6 02/21/2013 1329   BUN 13 11/17/2012 1032   CREATININE 0.7 02/21/2013 1329   CREATININE 0.67 11/17/2012 1032   CREATININE 0.89 02/03/2009 1045      Component Value Date/Time   CALCIUM 9.1 02/21/2013 1329   CALCIUM 9.5 11/17/2012 1032   ALKPHOS 143 02/21/2013 1329   ALKPHOS 129* 11/17/2012 1032   AST 15 02/21/2013 1329   AST 17 11/17/2012 1032   ALT 13 02/21/2013 1329   ALT 14 11/17/2012 1032   BILITOT 0.36 02/21/2013 1329   BILITOT 0.4 11/17/2012 1032       Basic Metabolic Panel: No results found for this basename: NA, K, CL, CO2, GLUCOSE, BUN, CREATININE, CALCIUM, MG, PHOS,  in the last 168 hours GFR The CrCl is unknown because both a height and weight (above a minimum accepted value) are required for this calculation. Liver Function Tests: No results found for this basename: AST, ALT, ALKPHOS, BILITOT, PROT, ALBUMIN,  in the last 168 hours No results found for this basename: LIPASE, AMYLASE,  in the last 168 hours No results found for this basename: AMMONIA,  in the last 168 hours Coagulation profile No results found for this basename: INR, PROTIME,  in the last 168 hours  CBC:  Recent Labs Lab 03/13/14 1523  WBC 6.2  NEUTROABS 4.5  HGB 12.7  HCT 39.7  MCV 83.3  PLT 270    Anemia work up No results found for this basename: VITAMINB12, FOLATE, FERRITIN, TIBC, IRON, RETICCTPCT,  in the last 72  hours  Studies:  No results found.   RADIOGRAPHIC STUDIES: No results found.  ASSESSMENT: Jill [name] 48 y.o. 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 Menometrorrhagia: Gynecologist to be seen later this month for a uterine biopsy.  She has had a uterine ultrasound with mild fibroids. She did not have one until this month described as vaginal spotting. She is perimenopausal.   2. Iron deficiency anemia due to menometrorrhagia: She cannot tolerate oral iron. I am waiting for her iron level to day to see if she would benefit from repeat IV iron.   3. Follow up:  Return visit in about 1 year with CBC and ferritin. Labs prn symptoms.   All questions were answered. The patient knows to call the clinic with any problems, questions or concerns. We can certainly see the patient much sooner if necessary.  I spent 15 minutes counseling the patient face to face. The total time spent in the appointment was  25 minutes.    Bayler Gehrig, MD 03/13/2014 4:06 PM

## 2014-03-14 LAB — FERRITIN CHCC: Ferritin: 57 ng/ml (ref 9–269)

## 2015-01-09 ENCOUNTER — Telehealth: Payer: Self-pay | Admitting: Hematology

## 2015-01-09 NOTE — Telephone Encounter (Signed)
Spoke with patient and she is aware of her new appointment with d feng

## 2015-03-13 ENCOUNTER — Other Ambulatory Visit: Payer: Self-pay | Admitting: *Deleted

## 2015-03-13 DIAGNOSIS — D500 Iron deficiency anemia secondary to blood loss (chronic): Secondary | ICD-10-CM

## 2015-03-14 ENCOUNTER — Ambulatory Visit: Payer: Self-pay | Admitting: Hematology

## 2015-03-14 ENCOUNTER — Other Ambulatory Visit: Payer: BC Managed Care – PPO

## 2015-03-14 ENCOUNTER — Other Ambulatory Visit: Payer: Self-pay

## 2015-03-14 ENCOUNTER — Other Ambulatory Visit: Payer: Self-pay | Admitting: *Deleted

## 2015-03-14 ENCOUNTER — Ambulatory Visit: Payer: BC Managed Care – PPO

## 2015-03-17 ENCOUNTER — Telehealth: Payer: Self-pay | Admitting: Hematology

## 2015-03-17 NOTE — Telephone Encounter (Signed)
lft msg for pt confirming labs/ov per 07/15 POF, mailed schedule to pt... KJ

## 2015-03-25 ENCOUNTER — Ambulatory Visit (HOSPITAL_BASED_OUTPATIENT_CLINIC_OR_DEPARTMENT_OTHER): Payer: BLUE CROSS/BLUE SHIELD | Admitting: Hematology

## 2015-03-25 ENCOUNTER — Telehealth: Payer: Self-pay | Admitting: Hematology

## 2015-03-25 ENCOUNTER — Other Ambulatory Visit (HOSPITAL_BASED_OUTPATIENT_CLINIC_OR_DEPARTMENT_OTHER): Payer: BLUE CROSS/BLUE SHIELD

## 2015-03-25 ENCOUNTER — Encounter: Payer: Self-pay | Admitting: Hematology

## 2015-03-25 VITALS — BP 145/85 | HR 82 | Temp 98.4°F | Resp 18 | Ht 63.0 in | Wt 260.4 lb

## 2015-03-25 DIAGNOSIS — N920 Excessive and frequent menstruation with regular cycle: Secondary | ICD-10-CM

## 2015-03-25 DIAGNOSIS — D500 Iron deficiency anemia secondary to blood loss (chronic): Secondary | ICD-10-CM

## 2015-03-25 LAB — CBC & DIFF AND RETIC
BASO%: 0.2 % (ref 0.0–2.0)
BASOS ABS: 0 10*3/uL (ref 0.0–0.1)
EOS ABS: 0.1 10*3/uL (ref 0.0–0.5)
EOS%: 0.9 % (ref 0.0–7.0)
HCT: 37.4 % (ref 34.8–46.6)
HGB: 12.4 g/dL (ref 11.6–15.9)
Immature Retic Fract: 6.9 % (ref 1.60–10.00)
LYMPH%: 15.2 % (ref 14.0–49.7)
MCH: 27.1 pg (ref 25.1–34.0)
MCHC: 33.2 g/dL (ref 31.5–36.0)
MCV: 81.7 fL (ref 79.5–101.0)
MONO#: 0.4 10*3/uL (ref 0.1–0.9)
MONO%: 7 % (ref 0.0–14.0)
NEUT%: 76.7 % (ref 38.4–76.8)
NEUTROS ABS: 4.5 10*3/uL (ref 1.5–6.5)
Platelets: 250 10*3/uL (ref 145–400)
RBC: 4.58 10*6/uL (ref 3.70–5.45)
RDW: 13.7 % (ref 11.2–14.5)
Retic %: 1.17 % (ref 0.70–2.10)
Retic Ct Abs: 53.59 10*3/uL (ref 33.70–90.70)
WBC: 5.8 10*3/uL (ref 3.9–10.3)
lymph#: 0.9 10*3/uL (ref 0.9–3.3)

## 2015-03-25 LAB — COMPREHENSIVE METABOLIC PANEL (CC13)
ALK PHOS: 142 U/L (ref 40–150)
ALT: 15 U/L (ref 0–55)
AST: 17 U/L (ref 5–34)
Albumin: 3.5 g/dL (ref 3.5–5.0)
Anion Gap: 8 mEq/L (ref 3–11)
BUN: 14.8 mg/dL (ref 7.0–26.0)
CO2: 27 meq/L (ref 22–29)
Calcium: 9.2 mg/dL (ref 8.4–10.4)
Chloride: 105 mEq/L (ref 98–109)
Creatinine: 1 mg/dL (ref 0.6–1.1)
EGFR: 76 mL/min/{1.73_m2} — AB (ref >90)
GLUCOSE: 93 mg/dL (ref 70–140)
Potassium: 3.8 mEq/L (ref 3.5–5.1)
Sodium: 140 mEq/L (ref 136–145)
TOTAL PROTEIN: 7.4 g/dL (ref 6.4–8.3)
Total Bilirubin: 0.31 mg/dL (ref 0.20–1.20)

## 2015-03-25 LAB — IRON AND TIBC CHCC
%SAT: 13 % — ABNORMAL LOW (ref 21–57)
IRON: 44 ug/dL (ref 41–142)
TIBC: 330 ug/dL (ref 236–444)
UIBC: 286 ug/dL (ref 120–384)

## 2015-03-25 LAB — FERRITIN CHCC: Ferritin: 43 ng/ml (ref 9–269)

## 2015-03-25 NOTE — Progress Notes (Signed)
[facility] OFFICE PROGRESS NOTE  POWELL,ELMIRA, [place]. [address] [place] [address]  DIAGNOSIS: Iron deficiency anemia due to chronic blood loss - Plan: CBC & Diff and Retic, Comprehensive metabolic panel (Cmet) - [facility], Ferritin, Iron and TIBC [facility]  No chief complaint on file.   CURRENT TREATMENT: Received feraheme on 04/28/2012 and 03/05/2013. Not able to tolerate oral iron   INTERVAL HISTORY: Jill [name] 49 y.o. female with a history of iron defiency anemia from menometrorrhagia is here for follow up.  She was last seen by Dr. Juliann Mule one year ago.   She is doing very well overall.she has good energy level and appetite. She denies any pain, abdominal discomfort, change of bowel habits, or other symptoms. She is not taking oral iron, her menstrual period has been irregular in the past 2 years, she had 2 or 3 periods in the past year. She'll probably perimenopause now.  MEDICAL HISTORY: Past Medical History  Diagnosis Date  . Fibroids   . Yeast infection   . H/O varicella   . History of measles, mumps, or rubella   . Yeast infection   . Bladder infection   . Hypertension   . Menometrorrhagia   . 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    ALLERGIES:  has No Known Allergies.  MEDICATIONS: has a current medication list which includes the following prescription(s): bupropion, b-12, hydrochlorothiazide, ibuprofen, multivitamin with minerals, and phentermine.  SURGICAL HISTORY:  Past Surgical History  Procedure Laterality Date  . Gastric bypass  2010  . Cystectomy  2002    polymod cyst  . Gallbladder surgery  1998  . Tubal ligation  1994    REVIEW OF SYSTEMS:   Constitutional: Denies fevers, chills or abnormal weight loss Eyes: Denies blurriness of vision Ears,  nose, mouth, throat, and face: Denies mucositis or sore throat Respiratory: Denies cough, dyspnea or wheezes Cardiovascular: Denies palpitation, chest discomfort or lower extremity swelling Gastrointestinal:  Denies nausea, heartburn or change in bowel habits Skin: Denies abnormal skin rashes Lymphatics: Denies new lymphadenopathy or easy bruising Neurological:Denies numbness, tingling or new weaknesses Behavioral/Psych: Mood is stable, no new changes  All other systems were reviewed with the patient and are negative.  PHYSICAL EXAMINATION: ECOG PERFORMANCE STATUS: 0 - Asymptomatic  Blood pressure 145/85, pulse 82, temperature 98.4 F (36.9 C), temperature source Oral, resp. rate 18, height 5\' 3"  (1.6 m), weight 260 lb 6.4 oz (118.117 kg), SpO2 100 %.  GENERAL:alert, no distress and comfortable; moderately obese SKIN: skin color, texture, turgor are normal, no rashes or significant lesions EYES: normal, Conjunctiva are pink and non-injected, sclera clear OROPHARYNX:no exudate, no erythema and lips, buccal mucosa, and tongue normal  NECK: supple, thyroid normal size, non-tender, without nodularity LYMPH:  no palpable lymphadenopathy in the cervical, axillary or supraclavicular LUNGS: clear to auscultation with normal breathing effort, no wheezes or rhonchi HEART: regular rate & rhythm and no murmurs and no lower extremity edema ABDOMEN:abdomen soft, non-tender and normal bowel sounds Musculoskeletal:no cyanosis of digits and no clubbing  NEURO: alert & oriented x 3 with fluent speech, no focal motor/sensory deficits  Labs:  CBC Latest Ref Rng 03/25/2015 03/13/2014 08/09/2013  WBC 3.9 - 10.3 10e3/uL 5.8 6.2 5.4  Hemoglobin 11.6 - 15.9 g/dL 12.4 12.7 11.9  Hematocrit 34.8 - 46.6 % 37.4 39.7 36.7  Platelets 145 - 400 10e3/uL 250 270 255    CMP Latest Ref Rng 03/25/2015 02/21/2013 11/17/2012  Glucose 70 - 140 mg/dl 93 80 81  BUN 7.0 - 26.0 mg/dL 14.8 16.6 13  Creatinine 0.6 - 1.1 mg/dL  1.0 0.7 0.67  Sodium 136 - 145 mEq/L 140 140 138  Potassium 3.5 - 5.1 mEq/L 3.8 4.2 4.3  Chloride 98 - 107 mEq/L - 109(H) 106  CO2 22 - 29 mEq/L 27 27 23   Calcium 8.4 - 10.4 mg/dL 9.2 9.1 9.5  Total Protein 6.4 - 8.3 g/dL 7.4 7.4 7.0  Total Bilirubin 0.20 - 1.20 mg/dL 0.31 0.36 0.4  Alkaline Phos 40 - 150 U/L 142 143 129(H)  AST 5 - 34 U/L 17 15 17   ALT 0 - 55 U/L 15 13 14    Ferritin  Status: Finalresult Visible to patient:  MyChart Nextappt: None Dx:  Anemia; Iron deficiency anemia due to...           Ref Range 35yr ago (03/13/14) 38yr ago (08/09/13) 28yr ago (02/21/13)    Ferritin 9 - 269 ng/ml 57 59 8 (L)R          RADIOGRAPHIC STUDIES: No results found.  ASSESSMENT: Jill [name] 49 y.o. female with a history of Iron deficiency anemia due to chronic blood loss - Plan: CBC & Diff and Retic, Comprehensive metabolic panel (Cmet) - [facility], Ferritin, Iron and TIBC [facility]   PLAN:  1. Iron deficiency anemia due to menometrorrhagia -her arm level was normal one year ago, and her hemoglobin is normal today, ferritin and iron study results still pending -Her menstrual period is scattered now, her iron deficient anemia has likely resolved -intake encouraged her to take iron riched food, I given a list of those foods.  2. Cancer screening -I encouraged her to have a colonoscopy next year when she turns to 34, she never had a 1 before -I encouraged her to continue annual screening mammogram. Her last one was 2 years ago. She will call her gynecologist and have mammogram in the next few months.  3. Follow up:  Return visit in about 1 year with CBC and ferritin. Labs prn symptoms.   All questions were answered. The patient knows to call the clinic with any problems, questions or concerns. We can certainly see the patient much sooner if necessary.  I spent 15 minutes counseling the patient face to face. The total time spent in the appointment was 25 minutes.    Truitt Merle, MD 03/25/2015 2:22 PM

## 2015-03-25 NOTE — Telephone Encounter (Signed)
Gave patient avs report and appointments for July 2017.  °

## 2016-03-17 ENCOUNTER — Other Ambulatory Visit: Payer: BLUE CROSS/BLUE SHIELD

## 2016-03-23 ENCOUNTER — Ambulatory Visit: Payer: BLUE CROSS/BLUE SHIELD | Admitting: Hematology

## 2016-03-23 NOTE — Progress Notes (Deleted)
[facility] OFFICE PROGRESS NOTE  POWELL,ELMIRA, PA-C [address]  [place] [address]  DIAGNOSIS: No diagnosis found.  No chief complaint on file.   CURRENT TREATMENT: Received feraheme on 04/28/2012 and 03/05/2013. Not able to tolerate oral iron   INTERVAL HISTORY: Jill [name] 50 y.o. female with a history of iron defiency anemia from menometrorrhagia is here for follow up.  She was last seen by Dr. Juliann Mule one year ago.   She is doing very well overall.she has good energy level and appetite. She denies any pain, abdominal discomfort, change of bowel habits, or other symptoms. She is not taking oral iron, her menstrual period has been irregular in the past 2 years, she had 2 or 3 periods in the past year. She'll probably perimenopause now.  MEDICAL HISTORY: Past Medical History:  Diagnosis Date  . Bladder infection   . Fibroids   . H/O varicella   . History of measles, mumps, or rubella   . Hypertension   . Iron deficiency anemia due to chronic blood loss   . Menometrorrhagia   . Yeast infection   . 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    ALLERGIES:  has No Known Allergies.  MEDICATIONS: has a current medication list which includes the following prescription(s): bupropion, b-12, hydrochlorothiazide, ibuprofen, multivitamin with minerals, and phentermine.  SURGICAL HISTORY:  Past Surgical History:  Procedure Laterality Date  . CYSTECTOMY  2002   polymod cyst  . GALLBLADDER SURGERY  1998  . GASTRIC BYPASS  2010  . TUBAL LIGATION  1994    REVIEW OF SYSTEMS:   Constitutional: Denies fevers, chills or abnormal weight loss Eyes: Denies blurriness of vision Ears, nose, mouth, throat, and face: Denies mucositis or sore throat Respiratory: Denies cough, dyspnea or wheezes Cardiovascular: Denies  palpitation, chest discomfort or lower extremity swelling Gastrointestinal:  Denies nausea, heartburn or change in bowel habits Skin: Denies abnormal skin rashes Lymphatics: Denies new lymphadenopathy or easy bruising Neurological:Denies numbness, tingling or new weaknesses Behavioral/Psych: Mood is stable, no new changes  All other systems were reviewed with the patient and are negative.  PHYSICAL EXAMINATION: ECOG PERFORMANCE STATUS: 0 - Asymptomatic  There were no vitals taken for this visit.  GENERAL:alert, no distress and comfortable; moderately obese SKIN: skin color, texture, turgor are normal, no rashes or significant lesions EYES: normal, Conjunctiva are pink and non-injected, sclera clear OROPHARYNX:no exudate, no erythema and lips, buccal mucosa, and tongue normal  NECK: supple, thyroid normal size, non-tender, without nodularity LYMPH:  no palpable lymphadenopathy in the cervical, axillary or supraclavicular LUNGS: clear to auscultation with normal breathing effort, no wheezes or rhonchi HEART: regular rate & rhythm and no murmurs and no lower extremity edema ABDOMEN:abdomen soft, non-tender and normal bowel sounds Musculoskeletal:no cyanosis of digits and no clubbing  NEURO: alert & oriented x 3 with fluent speech, no focal motor/sensory deficits  Labs:  CBC Latest Ref Rng & Units 03/25/2015 03/13/2014 08/09/2013  WBC 3.9 - 10.3 10e3/uL 5.8 6.2 5.4  Hemoglobin 11.6 - 15.9 g/dL 12.4 12.7 11.9  Hematocrit 34.8 - 46.6 % 37.4 39.7 36.7  Platelets 145 - 400 10e3/uL 250 270 255    CMP Latest Ref Rng & Units 03/25/2015 02/21/2013 11/17/2012  Glucose 70 - 140 mg/dl 93 80 81  BUN 7.0 - 26.0 mg/dL 14.8  16.6 13  Creatinine 0.6 - 1.1 mg/dL 1.0 0.7 0.67  Sodium 136 - 145 mEq/L 140 140 138  Potassium 3.5 - 5.1 mEq/L 3.8 4.2 4.3  Chloride 98 - 107 mEq/L - 109(H) 106  CO2 22 - 29 mEq/L 27 27 23   Calcium 8.4 - 10.4 mg/dL 9.2 9.1 9.5  Total Protein 6.4 - 8.3 g/dL 7.4 7.4 7.0  Total  Bilirubin 0.20 - 1.20 mg/dL 0.31 0.36 0.4  Alkaline Phos 40 - 150 U/L 142 143 129(H)  AST 5 - 34 U/L 17 15 17   ALT 0 - 55 U/L 15 13 14    Ferritin  Order: [id number]  Status:  Final result Visible to patient:  Yes (MyChart) Next appt:  Today at 02:45 PM in Oncology [facility]r Medico, Krista Blue, MD) Dx:  Iron deficiency anemia due to chronic...    Ref Range & Units 67mo ago (03/25/15) 34yr ago (03/13/14) 27yr ago (08/09/13) 86yr ago (02/21/13)   Ferritin 9 - 269 ng/ml 43 57 59 8R          RADIOGRAPHIC STUDIES: No results found.  ASSESSMENT: Jill [name] 50 y.o. female with a history of No diagnosis found.   PLAN:  1. Iron deficiency anemia due to menometrorrhagia -her arm level was normal one year ago, and her hemoglobin is normal today, ferritin and iron study results still pending -Her menstrual period is scattered now, her iron deficient anemia has likely resolved -intake encouraged her to take iron riched food, I given a list of those foods.  2. Cancer screening -I encouraged her to have a colonoscopy next year when she turns to 51, she never had a 1 before -I encouraged her to continue annual screening mammogram. Her last one was 2 years ago. She will call her gynecologist and have mammogram in the next few months.  3. Follow up:  Return visit in about 1 year with CBC and ferritin. Labs prn symptoms.   All questions were answered. The patient knows to call the clinic with any problems, questions or concerns. We can certainly see the patient much sooner if necessary.  I spent 15 minutes counseling the patient face to face. The total time spent in the appointment was 25 minutes.    Truitt Merle, MD 03/23/16 7:38 AM

## 2016-09-14 ENCOUNTER — Encounter (HOSPITAL_COMMUNITY): Payer: Self-pay

## 2017-02-19 ENCOUNTER — Other Ambulatory Visit: Payer: Self-pay | Admitting: Nurse Practitioner

## 2017-04-30 ENCOUNTER — Other Ambulatory Visit: Payer: Self-pay | Admitting: Nurse Practitioner

## 2017-09-08 ENCOUNTER — Encounter (HOSPITAL_COMMUNITY): Payer: Self-pay

## 2018-09-07 ENCOUNTER — Encounter (HOSPITAL_COMMUNITY): Payer: Self-pay

## 2018-11-09 ENCOUNTER — Ambulatory Visit (INDEPENDENT_AMBULATORY_CARE_PROVIDER_SITE_OTHER): Payer: BLUE CROSS/BLUE SHIELD | Admitting: Orthopaedic Surgery

## 2018-11-09 ENCOUNTER — Other Ambulatory Visit: Payer: Self-pay

## 2018-11-23 ENCOUNTER — Ambulatory Visit (INDEPENDENT_AMBULATORY_CARE_PROVIDER_SITE_OTHER): Payer: Self-pay

## 2018-11-23 ENCOUNTER — Ambulatory Visit (INDEPENDENT_AMBULATORY_CARE_PROVIDER_SITE_OTHER): Payer: BLUE CROSS/BLUE SHIELD | Admitting: Orthopaedic Surgery

## 2018-11-23 ENCOUNTER — Encounter (INDEPENDENT_AMBULATORY_CARE_PROVIDER_SITE_OTHER): Payer: Self-pay | Admitting: Orthopaedic Surgery

## 2018-11-23 ENCOUNTER — Telehealth (INDEPENDENT_AMBULATORY_CARE_PROVIDER_SITE_OTHER): Payer: Self-pay | Admitting: Radiology

## 2018-11-23 ENCOUNTER — Other Ambulatory Visit: Payer: Self-pay

## 2018-11-23 VITALS — Ht 62.0 in | Wt 260.0 lb

## 2018-11-23 DIAGNOSIS — G8929 Other chronic pain: Secondary | ICD-10-CM | POA: Diagnosis not present

## 2018-11-23 DIAGNOSIS — M25512 Pain in left shoulder: Secondary | ICD-10-CM | POA: Diagnosis not present

## 2018-11-23 NOTE — Telephone Encounter (Signed)
Patient confirmed appointment for this afternoon. She answered "No" to all COVID-19 screening questions.

## 2018-11-23 NOTE — Progress Notes (Signed)
Office Visit Note   Patient: Jill [name]           Date of Birth: 1966/04/27           MRN: [id number] Visit Date: 11/23/2018              Requested by: Earnstine Regal, PA-C [address] [place], [place] [address] PCP: Earnstine Regal, PA-C   Assessment & Plan: Visit Diagnoses:  1. Chronic left shoulder pain     Plan: Likely rotator cuff sprain versus rotator cuff tendinopathy she has limitation of internal rotation and may have a little bit of mild adhesive capsulitis.  We will set her up for some physical therapy evaluation and treatment recheck her in 4 weeks she is having persistent problems will consider subacromial injection.  Follow-Up Instructions: No follow-ups on file.   Orders:  Orders Placed This Encounter  Procedures  . XR Shoulder Left   No orders of the defined types were placed in this encounter.     Procedures: No procedures performed   Clinical Data: No additional findings.   Subjective: Chief Complaint  Patient presents with  . Left Shoulder - Pain    HPI 53 year old female lives in [place] seen with left shoulder pain onset 4 months ago.  States a few years ago she was either doing some pull downs or fly exercises when she had some pain in her nondominant left shoulder that was mild take a little bit and she had some persistent pain.  Starting 4 months ago she had increased pain difficulty with internal rotation only to her posterior axillary line.  She had difficulty fixing her hair outstretch reaching bothers her some.  Her PCP gave her an IM steroid injection which helped for a few days.  She denies numbness or tingling in her hand she states her PCP was concerned she might have a pinched nerve.  She denies numbness or tingling in her hand no fever or chills.  Review of Systems review of system positive for gastric bypass 2010 pilonidal cyst 2002 gallbladder 2005 2 ligation 1994.  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  Positive for anxiety acid reflux hypertension migraines and the gastric bypass.   Objective: Vital Signs: Ht 5\' 2"  (1.575 m)   Wt 260 lb (117.9 kg)   BMI 47.55 kg/m   Physical Exam Constitutional:      Appearance: She is well-developed.  HENT:     Head: Normocephalic.     Right Ear: External ear normal.     Left Ear: External ear normal.  Eyes:     Pupils: Pupils are equal, round, and reactive to light.  Neck:     Thyroid: No thyromegaly.     Trachea: No tracheal deviation.  Cardiovascular:     Rate and Rhythm: Normal rate.  Pulmonary:     Effort: Pulmonary effort is normal.  Abdominal:     Palpations: Abdomen is soft.  Skin:    General: Skin is warm and dry.  Neurological:     Mental Status: She is alert and oriented to person, place, and time.  Psychiatric:        Behavior: Behavior normal.     Ortho Exam patient has discomfort with resisted supraspinatus shin testing with empty can test.  Negative drop arm test.  Positive Neer negative Hawkins long head of the biceps normal no shoulder subluxation.  Limitation internal rotation with hand just to posterior  axillary line opposite hand she can reach to T8.  Negative crossarm abduction test.  AC joint coracoid is normal.  No winging of the scapula reflexes are 2-3+ and symmetrical.  Normal heel toe gait no lower extremity clonus negative Spurling no brachial plexus tenderness right or left. Specialty Comments:  No specialty comments available.  Imaging: No results found.   PMFS History: Patient Active Problem List   Diagnosis Date Noted  . Iron deficiency anemia due to chronic blood loss   . Menometrorrhagia   . 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 Anemia 04/27/2012   Past Medical History:  Diagnosis Date  . Anxiety   . Bladder infection   . Fibroids   . H/O varicella   . History of measles, mumps, or rubella   . Hypertension   . Iron deficiency anemia due to  chronic blood loss   . Menometrorrhagia   . Migraines   . Yeast infection   . Yeast infection     Family History  Problem Relation Age of Onset  . Heart disease Father   . Cancer Father        bladder, prostate  . Hypertension Father   . Hypertension Mother   . Anemia Mother        low iron/ receives blood transfusions  . Autoimmune disease Mother   . Asthma Maternal Grandmother   . Hypertension Sister   . Thyroid disease Sister     Past Surgical History:  Procedure Laterality Date  . CYSTECTOMY  2002   polymod cyst  . GALLBLADDER SURGERY  1998  . GASTRIC BYPASS  2010  . 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 Smoking status: Never Smoker  . Smokeless tobacco: Never Used  Substance and Sexual Activity  . Alcohol use: No  . Drug use: No  . Sexual activity: Yes    Partners: Male    Birth control/protection: None, Surgical    Comment: BTL

## 2018-12-21 ENCOUNTER — Other Ambulatory Visit: Payer: Self-pay

## 2018-12-21 ENCOUNTER — Ambulatory Visit (INDEPENDENT_AMBULATORY_CARE_PROVIDER_SITE_OTHER): Payer: PRIVATE HEALTH INSURANCE | Admitting: Orthopaedic Surgery
